# Patient Record
Sex: Female | Born: 1990 | Hispanic: Yes | Marital: Single | Smoking: Never smoker
Health system: Southern US, Community
[De-identification: ages and names within clinical notes are randomized; demographics above are authoritative.]

## PROBLEM LIST (undated history)

## (undated) DIAGNOSIS — O009 Unspecified ectopic pregnancy without intrauterine pregnancy: Secondary | ICD-10-CM

## (undated) DIAGNOSIS — O039 Complete or unspecified spontaneous abortion without complication: Secondary | ICD-10-CM

## (undated) HISTORY — DX: Unspecified ectopic pregnancy without intrauterine pregnancy: O00.90

## (undated) HISTORY — DX: Complete or unspecified spontaneous abortion without complication: O03.9

---

## 2011-10-11 DIAGNOSIS — O009 Unspecified ectopic pregnancy without intrauterine pregnancy: Secondary | ICD-10-CM

## 2011-10-11 HISTORY — DX: Unspecified ectopic pregnancy without intrauterine pregnancy: O00.90

## 2012-01-10 DIAGNOSIS — O039 Complete or unspecified spontaneous abortion without complication: Secondary | ICD-10-CM

## 2012-01-10 HISTORY — DX: Complete or unspecified spontaneous abortion without complication: O03.9

## 2013-04-17 ENCOUNTER — Ambulatory Visit (INDEPENDENT_AMBULATORY_CARE_PROVIDER_SITE_OTHER): Payer: PRIVATE HEALTH INSURANCE | Admitting: Physician Assistant

## 2013-04-17 VITALS — BP 94/62 | HR 76 | Temp 98.9°F | Resp 18 | Ht 60.5 in | Wt 125.8 lb

## 2013-04-17 DIAGNOSIS — B9689 Other specified bacterial agents as the cause of diseases classified elsewhere: Secondary | ICD-10-CM

## 2013-04-17 DIAGNOSIS — N898 Other specified noninflammatory disorders of vagina: Secondary | ICD-10-CM

## 2013-04-17 DIAGNOSIS — Z309 Encounter for contraceptive management, unspecified: Secondary | ICD-10-CM

## 2013-04-17 DIAGNOSIS — Z124 Encounter for screening for malignant neoplasm of cervix: Secondary | ICD-10-CM

## 2013-04-17 DIAGNOSIS — IMO0001 Reserved for inherently not codable concepts without codable children: Secondary | ICD-10-CM

## 2013-04-17 DIAGNOSIS — N760 Acute vaginitis: Secondary | ICD-10-CM

## 2013-04-17 DIAGNOSIS — Z113 Encounter for screening for infections with a predominantly sexual mode of transmission: Secondary | ICD-10-CM

## 2013-04-17 LAB — POCT URINALYSIS DIPSTICK
Bilirubin, UA: NEGATIVE
Leukocytes, UA: NEGATIVE
Nitrite, UA: NEGATIVE
Protein, UA: 30
Spec Grav, UA: 1.015
pH, UA: 8.5

## 2013-04-17 LAB — POCT WET PREP WITH KOH
Clue Cells Wet Prep HPF POC: 100
KOH Prep POC: NEGATIVE
RBC Wet Prep HPF POC: NEGATIVE
Trichomonas, UA: NEGATIVE
Yeast Wet Prep HPF POC: NEGATIVE

## 2013-04-17 LAB — POCT UA - MICROSCOPIC ONLY
Casts, Ur, LPF, POC: NEGATIVE
Crystals, Ur, HPF, POC: NEGATIVE
Mucus, UA: POSITIVE
Yeast, UA: NEGATIVE

## 2013-04-17 MED ORDER — NORELGESTROMIN-ETH ESTRADIOL 150-35 MCG/24HR TD PTWK: 1.0000 | MEDICATED_PATCH | TRANSDERMAL | Status: DC

## 2013-04-17 MED ORDER — METRONIDAZOLE 500 MG PO TABS: 500.0000 mg | ORAL_TABLET | Freq: Two times a day (BID) | ORAL | Status: DC

## 2013-04-17 NOTE — Progress Notes (Signed)
Subjective

## 2013-04-17 NOTE — Patient Instructions (Addendum)
Before you start trying to become pregnant again, please schedule with an OB/GYN to discuss pre-pregnancy planning and ways to reduce the risk of recurrent ectopic pregnancy. Use condoms to reduce the risk of sexually transmitted infections. We encourage you to obtain a flu vaccine each fall, to reduce the risk of influenza infection. It is important that you COMPLETE the antibiotic.   Ethinyl Estradiol; Norelgestromin skin patches What is this medicine? ETHINYL ESTRADIOL;NORELGESTROMIN (ETH in il es tra DYE ole; nor el JES troe min) skin patch is used as a contraceptive (birth control method). This medicine combines two types of female hormones, an estrogen and a progestin. This patch is used to prevent ovulation and pregnancy. This medicine may be used for other purposes; ask your health care provider or pharmacist if you have questions. What should I tell my health care provider before I take this medicine? They need to know if you have or ever had any of these conditions: -abnormal vaginal bleeding -blood vessel disease or blood clots -breast, cervical, endometrial, ovarian, liver, or uterine cancer -diabetes -gallbladder disease -heart disease or recent heart attack -high blood pressure -high cholesterol -kidney disease -liver disease -migraine headaches -stroke -systemic lupus erythematosus (SLE) -tobacco smoker -an unusual or allergic reaction to estrogens, progestins, other medicines, foods, dyes, or preservatives -pregnant or trying to get pregnant -breast-feeding How should I use this medicine? This patch is applied to the skin. Follow the directions on the prescription label. Apply to clean, dry, healthy skin on the buttock, abdomen, upper outer arm or upper torso, in a place where it will not be rubbed by tight clothing. Do not use lotions or other cosmetics on the site where the patch will go. Press the patch firmly in place for 10 seconds to ensure good contact with the  skin. Change the patch every 7 days on the same day of the week for 3 weeks. You will then have a break from the patch for 1 week, after which you will apply a new patch. Do not use your medicine more often than directed. Contact your pediatrician regarding the use of this medicine in children. Special care may be needed. This medicine has been used in female children who have started having menstrual periods. A patient package insert for the product will be given with each prescription and refill. Read this sheet carefully each time. The sheet may change frequently. Overdosage: If you think you have taken too much of this medicine contact a poison control center or emergency room at once. NOTE: This medicine is only for you. Do not share this medicine with others. What if I miss a dose? You will need to replace your patch once a week as directed. If your patch is lost or falls off, contact your health care professional for advice. You may need to use another form of birth control if your patch has been off for more than 1 day. What may interact with this medicine? -acetaminophen -antibiotics or medicines for infections, especially rifampin, rifabutin, rifapentine, and griseofulvin, and possibly penicillins or tetracyclines -aprepitant -ascorbic acid (vitamin C) -atorvastatin -barbiturate medicines, such as phenobarbital -bosentan -carbamazepine -caffeine -clofibrate -cyclosporine -dantrolene -doxercalciferol -felbamate -grapefruit juice -hydrocortisone -medicines for anxiety or sleeping problems, such as diazepam or temazepam -medicines for diabetes, including pioglitazone -modafinil -mycophenolate -nefazodone -oxcarbazepine -phenytoin -prednisolone -ritonavir or other medicines for HIV infection or AIDS -rosuvastatin -selegiline -soy isoflavones supplements -St. John's wort -tamoxifen or raloxifene -theophylline -thyroid hormones -topiramate -warfarin This list may not  describe all  possible interactions. Give your health care provider a list of all the medicines, herbs, non-prescription drugs, or dietary supplements you use. Also tell them if you smoke, drink alcohol, or use illegal drugs. Some items may interact with your medicine. What should I watch for while using this medicine? Visit your doctor or health care professional for regular checks on your progress. You will need a regular breast and pelvic exam and Pap smear while on this medicine. Use an additional method of contraception during the first cycle that you use this patch. If you have any reason to think you are pregnant, stop using this medicine right away and contact your doctor or health care professional. If you are using this medicine for hormone related problems, it may take several cycles of use to see improvement in your condition. Smoking increases the risk of getting a blood clot or having a stroke while you are using hormonal birth control, especially if you are more than 22 years old. You are strongly advised not to smoke. This medicine can make your body retain fluid, making your fingers, hands, or ankles swell. Your blood pressure can go up. Contact your doctor or health care professional if you feel you are retaining fluid. This medicine can make you more sensitive to the sun. Keep out of the sun. If you cannot avoid being in the sun, wear protective clothing and use sunscreen. Do not use sun lamps or tanning beds/booths. If you wear contact lenses and notice visual changes, or if the lenses begin to feel uncomfortable, consult your eye care specialist. In some women, tenderness, swelling, or minor bleeding of the gums may occur. Notify your dentist if this happens. Brushing and flossing your teeth regularly may help limit this. See your dentist regularly and inform your dentist of the medicines you are taking. If you are going to have elective surgery or a MRI, you may need to stop using  this medicine before the surgery or MRI. Consult your health care professional for advice. This medicine does not protect you against HIV infection (AIDS) or any other sexually transmitted diseases. What side effects may I notice from receiving this medicine? Side effects that you should report to your doctor or health care professional as soon as possible: -breast tissue changes or discharge -changes in vaginal bleeding during your period or between your periods -chest pain -coughing up blood -dizziness or fainting spells -headaches or migraines -leg, arm or groin pain -severe or sudden headaches -stomach pain (severe) -sudden shortness of breath -sudden loss of coordination, especially on one side of the body -speech problems -symptoms of vaginal infection like itching, irritation or unusual discharge -tenderness in the upper abdomen -vomiting -weakness or numbness in the arms or legs, especially on one side of the body -yellowing of the eyes or skin Side effects that usually do not require medical attention (report to your doctor or health care professional if they continue or are bothersome): -breakthrough bleeding and spotting that continues beyond the 3 initial cycles of pills -breast tenderness -mood changes, anxiety, depression, frustration, anger, or emotional outbursts -increased sensitivity to sun or ultraviolet light -nausea -skin rash, acne, or brown spots on the skin -weight gain (slight) This list may not describe all possible side effects. Call your doctor for medical advice about side effects. You may report side effects to FDA at 1-800-FDA-1088. Where should I keep my medicine? Keep out of the reach of children. Store at room temperature between 15 and 30 degrees C (59  and 86 degrees F). Keep the patch in its pouch until time of use. Throw away any unused medicine after the expiration date. Dispose of used patches properly. Since a used patch may still contain  active hormones, fold the patch in half so that it sticks to itself prior to disposal. Throw away in a place where children or pets cannot reach. NOTE: This sheet is a summary. It may not cover all possible information. If you have questions about this medicine, talk to your doctor, pharmacist, or health care provider.  2013, [employer]. (06/13/2008 12:06:24 PM)

## 2013-04-17 NOTE — Progress Notes (Signed)
Subjective:    Patient ID: Sophia [name], female    DOB: 10/15/1990, 22 y.o.   MRN: [id number]  HPI  Sophia [name] is a 22 year old female presents with vaginal irritation and odorous discharge. She has had similar problems in the past. She has a history of chlamydia but no STI currently. She was tested in the past year and was negative. She is currently sexually active and requests screening for STIs, "For everything.".   She would also like to discuss birth control options. She received Depo Provera injection x 1 in 10/2011 after oral treatment for ectopic pregnancy, and then became pregnant again before she was due for her second dose. That pregnancy ended in miscarriage. She was also unhappy with the Depo Provera due to weight gain. Hanh does not feel that she would take OCP reliably and doe not want to use anything that is inserted. She has researched the patch and thinks this is the best fit for her.    Review of Systems  Constitutional: Negative for fever.  Genitourinary: Positive for vaginal discharge and pelvic pain (pain 2-3 days before start of menses). Negative for dysuria, frequency, hematuria, vaginal bleeding, genital sores and menstrual problem.       Objective:   Physical Exam  Constitutional: She is oriented to person, place, and time. Vital signs are normal. She appears well-developed and well-nourished.  Pulmonary/Chest: Effort normal.  Genitourinary: Uterus normal. Rectal exam shows no external hemorrhoid. No breast swelling, tenderness (extremely ticklish) or discharge. Pelvic exam was performed with patient supine. There is no rash, tenderness or lesion on the right labia. There is no rash, tenderness or lesion on the left labia. Cervix exhibits no motion tenderness, no discharge and no friability. Right adnexum displays no mass, no tenderness and no fullness. Left adnexum displays no mass, no tenderness and no fullness. No erythema, tenderness or bleeding around  the vagina. Vaginal discharge (moderate to large thick malodorous fluid in vaginal vault) found.  Lymphadenopathy:       Right: No inguinal adenopathy present.       Left: No inguinal adenopathy present.  Neurological: She is alert and oriented to person, place, and time.  Skin: Skin is warm and dry.  Psychiatric: Her mood appears anxious.   BP 94/62  Pulse 76  Temp(Src) 98.9 F (37.2 C) (Oral)  Resp 18  Ht 5' 0.5" (1.537 m)  Wt 125 lb 12.8 oz (57.063 kg)  BMI 24.15 kg/m2  SpO2 100%  LMP 04/15/2013  Recent Results (from the past 2160 hour(s))  POCT URINALYSIS DIPSTICK     Status: None   Collection Time    04/17/13  6:37 PM      Result Value Range   Color, UA yellow     Clarity, UA cloudy     Glucose, UA neg     Bilirubin, UA neg     Ketones, UA neg     Spec Grav, UA 1.015     Blood, UA neg     pH, UA 8.5     Protein, UA 30     Urobilinogen, UA 1.0     Nitrite, UA neg     Leukocytes, UA Negative    POCT UA - MICROSCOPIC ONLY     Status: None   Collection Time    04/17/13  6:37 PM      Result Value Range   WBC, Ur, HPF, POC neg     RBC, urine, microscopic neg  Bacteria, U Microscopic 2+     Mucus, UA positive     Epithelial cells, urine per micros 0-1     Crystals, Ur, HPF, POC neg     Casts, Ur, LPF, POC neg     Yeast, UA neg     Amorphous large    POCT WET PREP WITH KOH     Status: None   Collection Time    04/17/13  8:12 PM      Result Value Range   Trichomonas, UA Negative     Clue Cells Wet Prep HPF POC 100%     Epithelial Wet Prep HPF POC 15-20     Yeast Wet Prep HPF POC neg     Bacteria Wet Prep HPF POC 4+     RBC Wet Prep HPF POC neg     WBC Wet Prep HPF POC 0-2     KOH Prep POC Negative          Assessment & Plan:  Discharge from the vagina - Plan: POCT urinalysis dipstick, POCT UA - Microscopic Only, POCT Wet Prep with KOH,   BV (bacterial vaginosis) - Plan: metroNIDAZOLE (FLAGYL) 500 MG tablet  Routine screening for STI (sexually  transmitted infection) - Plan: HSV(herpes simplex vrs) 1+2 ab-IgG, HIV antibody, Hepatitis B surface antibody, Hepatitis B surface antigen, Hepatitis C antibody, RPR, Pap IG, CT/NG w/ reflex HPV when ASC-U  Contraception - Plan: norelgestromin-ethinyl estradiol (ORTHO EVRA) 150-35 MCG/24HR transdermal patch  Screening for cervical cancer- Plan: Pap test  Spent significant time counseling patient on pros/cons of various birth control options, pregnancy and female sexual health. Patient counseled on how to properly use the Ortho Evra patch and to use condoms w/sexual intercourse for the next month to prevent pregnancy. Counseled using condoms even with the patch to prevent STI. Counseled referral to OB/GYN before trying to become pregnant again or if premenstrual pain continues.

## 2013-04-18 LAB — HEPATITIS B SURFACE ANTIGEN: Hepatitis B Surface Ag: NEGATIVE

## 2013-04-18 NOTE — Progress Notes (Signed)
I have examined this patient along with the student and agree.  

## 2013-04-20 LAB — PAP IG, CT-NG, RFX HPV ASCU
Chlamydia Probe Amp: NEGATIVE
GC Probe Amp: NEGATIVE

## 2013-04-20 LAB — HSV(HERPES SIMPLEX VRS) I + II AB-IGG
HSV 1 Glycoprotein G Ab, IgG: 10.81 IV — ABNORMAL HIGH
HSV 2 Glycoprotein G Ab, IgG: 0.23 IV

## 2013-06-04 ENCOUNTER — Ambulatory Visit (INDEPENDENT_AMBULATORY_CARE_PROVIDER_SITE_OTHER): Payer: PRIVATE HEALTH INSURANCE | Admitting: Family Medicine

## 2013-06-04 VITALS — BP 90/50 | HR 74 | Temp 98.9°F | Resp 18 | Ht 60.5 in | Wt 127.8 lb

## 2013-06-04 DIAGNOSIS — Z8742 Personal history of other diseases of the female genital tract: Secondary | ICD-10-CM

## 2013-06-04 DIAGNOSIS — Z8759 Personal history of other complications of pregnancy, childbirth and the puerperium: Secondary | ICD-10-CM

## 2013-06-04 DIAGNOSIS — Z331 Pregnant state, incidental: Secondary | ICD-10-CM

## 2013-06-04 DIAGNOSIS — Z3490 Encounter for supervision of normal pregnancy, unspecified, unspecified trimester: Secondary | ICD-10-CM

## 2013-06-04 DIAGNOSIS — N912 Amenorrhea, unspecified: Secondary | ICD-10-CM

## 2013-06-04 NOTE — Patient Instructions (Signed)

## 2013-06-04 NOTE — Progress Notes (Signed)
[facility]:  Office Visit  Chief Complaint:  Chief Complaint  Patient presents with  . Possible Pregnancy    pt took home pregnancy test that was positive, she is late and request blood test    HPI: Sophia Sophia [name] is a 22 y.o. female who is here for pregnancy test, 1 ectopic at 6 weeks when she was 20 and then she had a misscarriage at 3 weeks at age 61. LMP 05/05/2013 Menarche 11, irregular length, gets it every 30  Days, blood cots, cramping, heavy flow previosly  But now normal Has a history of STD + chlamydia Last tested several months ago for STDs Requesting blood work for hcG but then declined  Past Medical History  Diagnosis Date  . Ectopic pregnancy 10/2011  . Miscarriage 01/2012   History reviewed. No pertinent past surgical history. History   Social History  . Marital Status: Single    Spouse Name: n/a    Number of Children: 0  . Years of Education: 15   Occupational History  . [employer] Agent     [employer]   Social History Main Topics  . Smoking status: Never Smoker   . Smokeless tobacco: Never Used  . Alcohol Use: Yes     Comment: occasional  . Drug Use: No  . Sexual Activity: Yes    Partners: Male   Other Topics Concern  . None   Social History Narrative   Lives with a friend/roommate.  Working on completing her undergraduate degree.   Family History  Problem Relation Age of Onset  . Diabetes Mother   . ADD / ADHD Brother   . Diabetes Maternal Grandmother    No Known Allergies Prior to Admission medications   Medication Sig Start Date End Date Taking? Authorizing Provider  metroNIDAZOLE (FLAGYL) 500 MG tablet Take 1 tablet (500 mg total) by mouth 2 (two) times daily with a meal. DO NOT CONSUME ALCOHOL WHILE TAKING THIS MEDICATION. 04/17/13   Sophia Tessa Lerner, PA-C  norelgestromin-ethinyl estradiol (ORTHO EVRA) 150-35 MCG/24HR transdermal patch Place 1 patch onto the skin once a week. 04/17/13   Sophia S Jeffery, PA-C     ROS:  The patient denies fevers, chills, night sweats, unintentional Sophia [name] loss, chest pain, palpitations, wheezing, dyspnea on exertion, nausea, vomiting, abdominal pain, dysuria, hematuria, melena, numbness, weakness, or tingling.   All other systems have been reviewed and were otherwise negative with the exception of those mentioned in the HPI and as above.    PHYSICAL EXAM: Filed Vitals:   06/04/13 1641  BP: 90/50  Pulse: 74  Temp: 98.9 F (37.2 C)  Resp: 18   Filed Vitals:   06/04/13 1641  Height: 5' 0.5" (1.537 m)  Sophia [name]: 127 lb 12.8 oz (57.97 kg)   Body mass index is 24.54 kg/(m^2).  General: Alert, no acute distress HEENT:  Normocephalic, atraumatic, oropharynx patent. EOMI, PERRLA Cardiovascular:  Regular rate and rhythm, no rubs murmurs or gallops.  No Carotid bruits, radial pulse intact. No pedal edema.  Respiratory: Clear to auscultation bilaterally.  No wheezes, rales, or rhonchi.  No cyanosis, no use of accessory musculature GI: No organomegaly, abdomen is soft and non-tender, positive bowel sounds.  No masses. Skin: No rashes. Neurologic: Facial musculature symmetric. Psychiatric: Patient is appropriate throughout our interaction. Lymphatic: No cervical lymphadenopathy Musculoskeletal: Gait intact.   LABS: Results for orders placed in visit on 06/04/13  POCT URINE PREGNANCY      Result Value Range   Preg Test,  Ur Positive       EKG/XRAY:   Primary read interpreted by Dr. Conley [name] at [facility].   ASSESSMENT/PLAN: Encounter Diagnoses  Name Primary?  . Amenorrhea Yes  . Pregnant   . History of ectopic pregnancy    22 Y/o Hispanic female [id number] who is roughly 4 weeks and 2 days gestation  based on LMP of 05/05/2013. She wanted a blood HcG in addition to urine pregnancy test, after much discussion she declined.  Advise to take prenatal vitamins, avoid alcohol and tobacco use She will set herself up with an Ob/gyn as soon as possible due to history of ectopics, we can  refer her to [facility] prn F/u prn  Gross sideeffects, risk and benefits, and alternatives of medications d/w patient. Patient is aware that all medications have potential sideeffects and we are unable to predict every sideeffect or drug-drug interaction that may occur.  Sophia Capri PHUONG, DO 06/04/2013 5:09 PM

## 2013-08-26 ENCOUNTER — Encounter (HOSPITAL_COMMUNITY): Payer: Self-pay | Admitting: Anesthesiology

## 2013-08-26 ENCOUNTER — Emergency Department (HOSPITAL_COMMUNITY): Payer: Self-pay

## 2013-08-26 ENCOUNTER — Encounter (HOSPITAL_COMMUNITY): Payer: Self-pay | Admitting: Emergency Medicine

## 2013-08-26 ENCOUNTER — Observation Stay (HOSPITAL_COMMUNITY)
Admission: EM | Admit: 2013-08-26 | Discharge: 2013-08-27 | Disposition: A | Discharge status: Home / Self Care | Payer: Self-pay | Attending: General Surgery | Admitting: General Surgery

## 2013-08-26 ENCOUNTER — Encounter (HOSPITAL_COMMUNITY)
Admission: EM | Disposition: A | Discharge status: Home / Self Care | Payer: Self-pay | Source: Home / Self Care | Attending: Emergency Medicine

## 2013-08-26 ENCOUNTER — Observation Stay (HOSPITAL_COMMUNITY): Payer: Self-pay | Admitting: Anesthesiology

## 2013-08-26 DIAGNOSIS — K3580 Unspecified acute appendicitis: Secondary | ICD-10-CM

## 2013-08-26 DIAGNOSIS — K37 Unspecified appendicitis: Secondary | ICD-10-CM

## 2013-08-26 HISTORY — PX: LAPAROSCOPIC APPENDECTOMY: SHX408

## 2013-08-26 LAB — CBC WITH DIFFERENTIAL/PLATELET
BASOS PCT: 0 % (ref 0–1)
Basophils Absolute: 0 10*3/uL (ref 0.0–0.1)
EOS ABS: 0.1 10*3/uL (ref 0.0–0.7)
Eosinophils Relative: 1 % (ref 0–5)
HCT: 36.4 % (ref 36.0–46.0)
Hemoglobin: 13.1 g/dL (ref 12.0–15.0)
Lymphocytes Relative: 11 % — ABNORMAL LOW (ref 12–46)
Lymphs Abs: 1.7 10*3/uL (ref 0.7–4.0)
MCH: 32.3 pg (ref 26.0–34.0)
MCHC: 36 g/dL (ref 30.0–36.0)
MCV: 89.9 fL (ref 78.0–100.0)
Monocytes Absolute: 0.8 10*3/uL (ref 0.1–1.0)
Monocytes Relative: 5 % (ref 3–12)
NEUTROS ABS: 12.2 10*3/uL — AB (ref 1.7–7.7)
Neutrophils Relative %: 83 % — ABNORMAL HIGH (ref 43–77)
PLATELETS: 235 10*3/uL (ref 150–400)
RBC: 4.05 MIL/uL (ref 3.87–5.11)
RDW: 12.4 % (ref 11.5–15.5)
WBC: 14.8 10*3/uL — ABNORMAL HIGH (ref 4.0–10.5)

## 2013-08-26 LAB — URINALYSIS, ROUTINE W REFLEX MICROSCOPIC
BILIRUBIN URINE: NEGATIVE
Glucose, UA: NEGATIVE mg/dL
HGB URINE DIPSTICK: NEGATIVE
Ketones, ur: 40 mg/dL — AB
Leukocytes, UA: NEGATIVE
Nitrite: NEGATIVE
PH: 6 (ref 5.0–8.0)
Protein, ur: NEGATIVE mg/dL
SPECIFIC GRAVITY, URINE: 1.024 (ref 1.005–1.030)
Urobilinogen, UA: 0.2 mg/dL (ref 0.0–1.0)

## 2013-08-26 LAB — COMPREHENSIVE METABOLIC PANEL WITH GFR
ALT: 27 U/L (ref 0–35)
AST: 26 U/L (ref 0–37)
Albumin: 4 g/dL (ref 3.5–5.2)
Alkaline Phosphatase: 53 U/L (ref 39–117)
BUN: 15 mg/dL (ref 6–23)
CO2: 22 meq/L (ref 19–32)
Calcium: 9.1 mg/dL (ref 8.4–10.5)
Chloride: 102 meq/L (ref 96–112)
Creatinine, Ser: 0.68 mg/dL (ref 0.50–1.10)
GFR calc Af Amer: >90 mL/min (ref >90)
GFR calc non Af Amer: >90 mL/min (ref >90)
Glucose, Bld: 116 mg/dL — ABNORMAL HIGH (ref 70–99)
Potassium: 3.7 meq/L (ref 3.7–5.3)
Sodium: 139 meq/L (ref 137–147)
Total Bilirubin: 1 mg/dL (ref 0.3–1.2)
Total Protein: 7.4 g/dL (ref 6.0–8.3)

## 2013-08-26 LAB — LIPASE, BLOOD: Lipase: 55 U/L (ref 11–59)

## 2013-08-26 LAB — POCT PREGNANCY, URINE: Preg Test, Ur: NEGATIVE

## 2013-08-26 SURGERY — APPENDECTOMY, LAPAROSCOPIC
Anesthesia: General

## 2013-08-26 MED ORDER — ONDANSETRON HCL 4 MG/2ML IJ SOLN: 4.0000 mg | Freq: Four times a day (QID) | INTRAMUSCULAR | Status: DC | PRN

## 2013-08-26 MED ORDER — DIPHENHYDRAMINE HCL 50 MG/ML IJ SOLN
25.0000 mg | Freq: Once | INTRAMUSCULAR | Status: AC
  Administered 2013-08-26: 25 mg via INTRAVENOUS
  Filled 2013-08-26: qty 1

## 2013-08-26 MED ORDER — MIDAZOLAM HCL 2 MG/2ML IJ SOLN
INTRAMUSCULAR | Status: AC
  Filled 2013-08-26: qty 2

## 2013-08-26 MED ORDER — OXYCODONE HCL 5 MG PO TABS: 5.0000 mg | ORAL_TABLET | Freq: Once | ORAL | Status: DC | PRN

## 2013-08-26 MED ORDER — DEXAMETHASONE SODIUM PHOSPHATE 4 MG/ML IJ SOLN
INTRAMUSCULAR | Status: DC | PRN
  Administered 2013-08-26: 8 mg via INTRAVENOUS

## 2013-08-26 MED ORDER — MEPERIDINE HCL 25 MG/ML IJ SOLN: 6.2500 mg | INTRAMUSCULAR | Status: DC | PRN

## 2013-08-26 MED ORDER — OXYCODONE HCL 5 MG/5ML PO SOLN: 5.0000 mg | Freq: Once | ORAL | Status: DC | PRN

## 2013-08-26 MED ORDER — ENOXAPARIN SODIUM 40 MG/0.4ML ~~LOC~~ SOLN
40.0000 mg | SUBCUTANEOUS | Status: DC
  Administered 2013-08-26: 40 mg via SUBCUTANEOUS
  Filled 2013-08-26 (×2): qty 0.4

## 2013-08-26 MED ORDER — SUCCINYLCHOLINE CHLORIDE 20 MG/ML IJ SOLN
INTRAMUSCULAR | Status: AC
  Filled 2013-08-26: qty 1

## 2013-08-26 MED ORDER — MORPHINE SULFATE 4 MG/ML IJ SOLN
4.0000 mg | Freq: Once | INTRAMUSCULAR | Status: AC
  Administered 2013-08-26: 4 mg via INTRAVENOUS
  Filled 2013-08-26: qty 1

## 2013-08-26 MED ORDER — MORPHINE SULFATE 2 MG/ML IJ SOLN: 2.0000 mg | INTRAMUSCULAR | Status: DC | PRN

## 2013-08-26 MED ORDER — NEOSTIGMINE METHYLSULFATE 1 MG/ML IJ SOLN
INTRAMUSCULAR | Status: DC | PRN
  Administered 2013-08-26: 3 mg via INTRAVENOUS

## 2013-08-26 MED ORDER — SODIUM CHLORIDE 0.9 % IV SOLN
INTRAVENOUS | Status: DC
  Administered 2013-08-26 (×2): via INTRAVENOUS

## 2013-08-26 MED ORDER — ONDANSETRON HCL 4 MG/2ML IJ SOLN: 4.0000 mg | Freq: Once | INTRAMUSCULAR | Status: DC | PRN

## 2013-08-26 MED ORDER — ONDANSETRON HCL 4 MG/2ML IJ SOLN
INTRAMUSCULAR | Status: AC
  Filled 2013-08-26: qty 2

## 2013-08-26 MED ORDER — ROCURONIUM BROMIDE 100 MG/10ML IV SOLN
INTRAVENOUS | Status: DC | PRN
  Administered 2013-08-26: 15 mg via INTRAVENOUS
  Administered 2013-08-26: 5 mg via INTRAVENOUS

## 2013-08-26 MED ORDER — MIDAZOLAM HCL 5 MG/5ML IJ SOLN
INTRAMUSCULAR | Status: DC | PRN
  Administered 2013-08-26: 2 mg via INTRAVENOUS

## 2013-08-26 MED ORDER — HYDROMORPHONE HCL PF 1 MG/ML IJ SOLN
0.2500 mg | INTRAMUSCULAR | Status: DC | PRN
  Administered 2013-08-26 (×3): 0.5 mg via INTRAVENOUS

## 2013-08-26 MED ORDER — FENTANYL CITRATE 0.05 MG/ML IJ SOLN
INTRAMUSCULAR | Status: AC
  Filled 2013-08-26: qty 5

## 2013-08-26 MED ORDER — SODIUM CHLORIDE 0.9 % IV SOLN
1000.0000 mL | Freq: Once | INTRAVENOUS | Status: AC
  Administered 2013-08-26: 1000 mL via INTRAVENOUS

## 2013-08-26 MED ORDER — ONDANSETRON HCL 4 MG/2ML IJ SOLN
INTRAMUSCULAR | Status: DC | PRN
  Administered 2013-08-26: 4 mg via INTRAVENOUS

## 2013-08-26 MED ORDER — PROPOFOL 10 MG/ML IV BOLUS
INTRAVENOUS | Status: AC
  Filled 2013-08-26: qty 20

## 2013-08-26 MED ORDER — DEXAMETHASONE SODIUM PHOSPHATE 4 MG/ML IJ SOLN
INTRAMUSCULAR | Status: AC
  Filled 2013-08-26: qty 2

## 2013-08-26 MED ORDER — METOCLOPRAMIDE HCL 5 MG/ML IJ SOLN
10.0000 mg | Freq: Once | INTRAMUSCULAR | Status: AC
Start: 2013-08-26 — End: 2013-08-26
  Administered 2013-08-26: 10 mg via INTRAVENOUS
  Filled 2013-08-26: qty 2

## 2013-08-26 MED ORDER — PIPERACILLIN-TAZOBACTAM 3.375 G IVPB
3.3750 g | Freq: Three times a day (TID) | INTRAVENOUS | Status: DC
  Administered 2013-08-26: 3.375 g via INTRAVENOUS
  Filled 2013-08-26 (×3): qty 50

## 2013-08-26 MED ORDER — SODIUM CHLORIDE 0.9 % IV SOLN
1000.0000 mL | INTRAVENOUS | Status: DC
  Administered 2013-08-26 (×2): 1000 mL via INTRAVENOUS

## 2013-08-26 MED ORDER — LIDOCAINE HCL (CARDIAC) 20 MG/ML IV SOLN
INTRAVENOUS | Status: DC | PRN
  Administered 2013-08-26: 100 mg via INTRAVENOUS

## 2013-08-26 MED ORDER — PROPOFOL 10 MG/ML IV BOLUS
INTRAVENOUS | Status: DC | PRN
  Administered 2013-08-26: 100 mg via INTRAVENOUS

## 2013-08-26 MED ORDER — LACTATED RINGERS IV SOLN
INTRAVENOUS | Status: DC | PRN
  Administered 2013-08-26 (×2): via INTRAVENOUS

## 2013-08-26 MED ORDER — SUCCINYLCHOLINE CHLORIDE 20 MG/ML IJ SOLN
INTRAMUSCULAR | Status: DC | PRN
  Administered 2013-08-26: 120 mg via INTRAVENOUS

## 2013-08-26 MED ORDER — BUPIVACAINE-EPINEPHRINE (PF) 0.25% -1:200000 IJ SOLN
INTRAMUSCULAR | Status: AC
  Filled 2013-08-26: qty 30

## 2013-08-26 MED ORDER — GLYCOPYRROLATE 0.2 MG/ML IJ SOLN
INTRAMUSCULAR | Status: DC | PRN
  Administered 2013-08-26: 0.4 mg via INTRAVENOUS

## 2013-08-26 MED ORDER — HYDROMORPHONE HCL PF 1 MG/ML IJ SOLN
INTRAMUSCULAR | Status: AC
  Administered 2013-08-26: 0.5 mg via INTRAVENOUS
  Filled 2013-08-26: qty 1

## 2013-08-26 MED ORDER — LACTATED RINGERS IV SOLN: INTRAVENOUS | Status: DC | PRN

## 2013-08-26 MED ORDER — BUPIVACAINE HCL 0.25 % IJ SOLN
INTRAMUSCULAR | Status: DC | PRN
  Administered 2013-08-26: 4 mL

## 2013-08-26 MED ORDER — ONDANSETRON HCL 4 MG/2ML IJ SOLN
4.0000 mg | Freq: Once | INTRAMUSCULAR | Status: AC
  Administered 2013-08-26: 4 mg via INTRAVENOUS
  Filled 2013-08-26: qty 2

## 2013-08-26 MED ORDER — FENTANYL CITRATE 0.05 MG/ML IJ SOLN
INTRAMUSCULAR | Status: DC | PRN
  Administered 2013-08-26: 50 ug via INTRAVENOUS
  Administered 2013-08-26: 100 ug via INTRAVENOUS
  Administered 2013-08-26 (×2): 50 ug via INTRAVENOUS

## 2013-08-26 MED ORDER — SODIUM CHLORIDE 0.9 % IR SOLN
Status: DC | PRN
  Administered 2013-08-26: 1000 mL

## 2013-08-26 MED ORDER — HYDROCODONE-ACETAMINOPHEN 5-325 MG PO TABS
1.0000 | ORAL_TABLET | ORAL | Status: DC | PRN
  Administered 2013-08-26 – 2013-08-27 (×3): 2 via ORAL
  Filled 2013-08-26 (×3): qty 2

## 2013-08-26 MED ORDER — LIDOCAINE HCL (CARDIAC) 20 MG/ML IV SOLN
INTRAVENOUS | Status: AC
  Filled 2013-08-26: qty 5

## 2013-08-26 MED ORDER — ROCURONIUM BROMIDE 50 MG/5ML IV SOLN
INTRAVENOUS | Status: AC
  Filled 2013-08-26: qty 1

## 2013-08-26 MED ORDER — SODIUM CHLORIDE 0.9 % IV SOLN
INTRAVENOUS | Status: DC
  Administered 2013-08-26: 22:00:00 via INTRAVENOUS

## 2013-08-26 MED ORDER — IOHEXOL 300 MG/ML  SOLN
25.0000 mL | INTRAMUSCULAR | Status: AC
  Administered 2013-08-26: 25 mL via ORAL

## 2013-08-26 MED ORDER — IOHEXOL 300 MG/ML  SOLN
100.0000 mL | Freq: Once | INTRAMUSCULAR | Status: AC | PRN
  Administered 2013-08-26: 100 mL via INTRAVENOUS

## 2013-08-26 MED ORDER — HYDROMORPHONE HCL PF 1 MG/ML IJ SOLN
INTRAMUSCULAR | Status: AC
  Filled 2013-08-26: qty 1

## 2013-08-26 SURGICAL SUPPLY — 39 items
APPLIER CLIP 5 13 M/L LIGAMAX5 (MISCELLANEOUS)
BENZOIN TINCTURE PRP APPL 2/3 (GAUZE/BANDAGES/DRESSINGS) ×3 IMPLANT
BLADE SURG ROTATE 9660 (MISCELLANEOUS) IMPLANT
CANISTER SUCTION 2500CC (MISCELLANEOUS) ×3 IMPLANT
CHLORAPREP W/TINT 26ML (MISCELLANEOUS) ×3 IMPLANT
CLIP APPLIE 5 13 M/L LIGAMAX5 (MISCELLANEOUS) IMPLANT
CLOSURE STERI-STRIP 1/2X4 (GAUZE/BANDAGES/DRESSINGS) ×1
CLSR STERI-STRIP ANTIMIC 1/2X4 (GAUZE/BANDAGES/DRESSINGS) ×2 IMPLANT
COVER SURGICAL LIGHT HANDLE (MISCELLANEOUS) ×3 IMPLANT
COVER TRANSDUCER ULTRASND (DRAPES) ×3 IMPLANT
DEVICE TROCAR PUNCTURE CLOSURE (ENDOMECHANICALS) ×3 IMPLANT
DRAPE UTILITY 15X26 W/TAPE STR (DRAPE) ×6 IMPLANT
ELECT REM PT RETURN 9FT ADLT (ELECTROSURGICAL) ×3
ELECTRODE REM PT RTRN 9FT ADLT (ELECTROSURGICAL) ×1 IMPLANT
ENDOLOOP SUT PDS II  0 18 (SUTURE) ×6
ENDOLOOP SUT PDS II 0 18 (SUTURE) ×3 IMPLANT
GAUZE SPONGE 2X2 8PLY STRL LF (GAUZE/BANDAGES/DRESSINGS) ×1 IMPLANT
GLOVE BIO SURGEON STRL SZ7.5 (GLOVE) ×3 IMPLANT
GOWN STRL NON-REIN LRG LVL3 (GOWN DISPOSABLE) ×6 IMPLANT
GOWN STRL REIN XL XLG (GOWN DISPOSABLE) ×3 IMPLANT
KIT BASIN OR (CUSTOM PROCEDURE TRAY) ×3 IMPLANT
KIT ROOM TURNOVER OR (KITS) ×3 IMPLANT
NEEDLE INSUFFLATION 14GA 120MM (NEEDLE) ×3 IMPLANT
NS IRRIG 1000ML POUR BTL (IV SOLUTION) ×3 IMPLANT
PAD ARMBOARD 7.5X6 YLW CONV (MISCELLANEOUS) ×6 IMPLANT
SCISSORS LAP 5X35 DISP (ENDOMECHANICALS) ×3 IMPLANT
SET IRRIG TUBING LAPAROSCOPIC (IRRIGATION / IRRIGATOR) ×3 IMPLANT
SLEEVE ENDOPATH XCEL 5M (ENDOMECHANICALS) ×3 IMPLANT
SPECIMEN JAR SMALL (MISCELLANEOUS) ×3 IMPLANT
SPONGE GAUZE 2X2 STER 10/PKG (GAUZE/BANDAGES/DRESSINGS) ×2
SUT MNCRL AB 3-0 PS2 18 (SUTURE) ×3 IMPLANT
SUT VIC AB 1 BRD 54 (SUTURE) IMPLANT
TAPE CLOTH SURG 4X10 WHT LF (GAUZE/BANDAGES/DRESSINGS) ×3 IMPLANT
TOWEL OR 17X24 6PK STRL BLUE (TOWEL DISPOSABLE) ×3 IMPLANT
TOWEL OR 17X26 10 PK STRL BLUE (TOWEL DISPOSABLE) ×3 IMPLANT
TRAY FOLEY CATH 16FR SILVER (SET/KITS/TRAYS/PACK) ×3 IMPLANT
TRAY LAPAROSCOPIC (CUSTOM PROCEDURE TRAY) ×3 IMPLANT
TROCAR XCEL NON-BLD 11X100MML (ENDOMECHANICALS) ×3 IMPLANT
TROCAR XCEL NON-BLD 5MMX100MML (ENDOMECHANICALS) ×3 IMPLANT

## 2013-08-26 NOTE — ED Notes (Signed)
Pt states she awoke with abdominal pain to generalized lower abdomen, reports pain upon palpation. LMP four days ago, brownish discharge at first but she states she thinks it was normal. Pt states she still has appendix.

## 2013-08-26 NOTE — ED Provider Notes (Signed)
CSN: [id number]     Arrival date & time 08/26/13  [id number] History   First MD Initiated Contact with Patient 08/26/13 0423     Chief Complaint  Patient presents with  . Abdominal Pain     (Consider location/radiation/quality/duration/timing/severity/associated sxs/prior Treatment) Patient is a 23 y.o. female presenting with abdominal pain. The history is provided by the patient.  Abdominal Pain She was awakened at about midnight of by severe, generalized abdominal pain. Pain is crampy and she rates it at 10/10. Nothing makes it better nothing makes it worse. There is associated nausea and vomiting. Pain is not better following the Mrs. There's been no constipation diarrhea. She denies fever or chills. She denies any sick contacts. She has not done anything to try and treat her pain.  Past Medical History  Diagnosis Date  . Ectopic pregnancy 10/2011  . Miscarriage 01/2012   History reviewed. No pertinent past surgical history. Family History  Problem Relation Age of Onset  . Diabetes Mother   . ADD / ADHD Brother   . Diabetes Maternal Grandmother    History  Substance Use Topics  . Smoking status: Never Smoker   . Smokeless tobacco: Never Used  . Alcohol Use: Yes     Comment: occasional   OB History   Grav Para Term Preterm Abortions TAB SAB Ect Mult Living                 Review of Systems  Gastrointestinal: Positive for abdominal pain.  All other systems reviewed and are negative.      Allergies  Review of patient's allergies indicates no known allergies.  Home Medications  No current outpatient prescriptions on file. BP 100/56  Pulse 78  Temp(Src) 97.8 F (36.6 C)  Resp 22  Ht 5\' 1"  (1.549 m)  Wt 125 lb (56.7 kg)  BMI 23.63 kg/m2  SpO2 99%  LMP 08/23/2013 Physical Exam  Nursing note and vitals reviewed.  23 year old female, who appears to be in pain, but is in no acute distress. Vital signs are significant for tachypnea with respiratory rate of 22. Oxygen  saturation is 99%, which is normal. Head is normocephalic and atraumatic. PERRLA, EOMI. Oropharynx is clear. Neck is nontender and supple without adenopathy or JVD. Back is nontender and there is no CVA tenderness. Lungs are clear without rales, wheezes, or rhonchi. Chest is nontender. Heart has regular rate and rhythm without murmur. Abdomen is soft, flat, with mild tenderness diffusely. There is no localized pain. There is no rebound or guarding. There are no masses or hepatosplenomegaly and peristalsis is hypoactive. Extremities have no cyanosis or edema, full range of motion is present. Skin is warm and dry without rash. Neurologic: Mental status is normal, cranial nerves are intact, there are no motor or sensory deficits.  ED Course  Procedures (including critical care time) Labs Review Results for orders placed during the hospital encounter of 08/26/13  CBC WITH DIFFERENTIAL      Result Value Ref Range   WBC 14.8 (*) 4.0 - 10.5 K/uL   RBC 4.05  3.87 - 5.11 MIL/uL   Hemoglobin 13.1  12.0 - 15.0 g/dL   HCT 11.936.4  14.736.0 - 82.946.0 %   MCV 89.9  78.0 - 100.0 fL   MCH 32.3  26.0 - 34.0 pg   MCHC 36.0  30.0 - 36.0 g/dL   RDW 56.212.4  13.011.5 - 86.515.5 %   Platelets 235  150 - 400 K/uL   Neutrophils Relative %  83 (*) 43 - 77 %   Neutro Abs 12.2 (*) 1.7 - 7.7 K/uL   Lymphocytes Relative 11 (*) 12 - 46 %   Lymphs Abs 1.7  0.7 - 4.0 K/uL   Monocytes Relative 5  3 - 12 %   Monocytes Absolute 0.8  0.1 - 1.0 K/uL   Eosinophils Relative 1  0 - 5 %   Eosinophils Absolute 0.1  0.0 - 0.7 K/uL   Basophils Relative 0  0 - 1 %   Basophils Absolute 0.0  0.0 - 0.1 K/uL  COMPREHENSIVE METABOLIC PANEL      Result Value Ref Range   Sodium 139  137 - 147 mEq/L   Potassium 3.7  3.7 - 5.3 mEq/L   Chloride 102  96 - 112 mEq/L   CO2 22  19 - 32 mEq/L   Glucose, Bld 116 (*) 70 - 99 mg/dL   BUN 15  6 - 23 mg/dL   Creatinine, Ser 4.09  0.50 - 1.10 mg/dL   Calcium 9.1  8.4 - 81.1 mg/dL   Total Protein 7.4  6.0 -  8.3 g/dL   Albumin 4.0  3.5 - 5.2 g/dL   AST 26  0 - 37 U/L   ALT 27  0 - 35 U/L   Alkaline Phosphatase 53  39 - 117 U/L   Total Bilirubin 1.0  0.3 - 1.2 mg/dL   GFR calc non Af Amer >90  >90 mL/min   GFR calc Af Amer >90  >90 mL/min  LIPASE, BLOOD      Result Value Ref Range   Lipase 55  11 - 59 U/L  URINALYSIS, ROUTINE W REFLEX MICROSCOPIC      Result Value Ref Range   Color, Urine YELLOW  YELLOW   APPearance CLEAR  CLEAR   Specific Gravity, Urine 1.024  1.005 - 1.030   pH 6.0  5.0 - 8.0   Glucose, UA NEGATIVE  NEGATIVE mg/dL   Hgb urine dipstick NEGATIVE  NEGATIVE   Bilirubin Urine NEGATIVE  NEGATIVE   Ketones, ur 40 (*) NEGATIVE mg/dL   Protein, ur NEGATIVE  NEGATIVE mg/dL   Urobilinogen, UA 0.2  0.0 - 1.0 mg/dL   Nitrite NEGATIVE  NEGATIVE   Leukocytes, UA NEGATIVE  NEGATIVE  POCT PREGNANCY, URINE      Result Value Ref Range   Preg Test, Ur NEGATIVE  NEGATIVE   Ct Abdomen Pelvis W Contrast  08/26/2013   CLINICAL DATA:  Abdominal pain which is generalized with vomiting.  EXAM: CT ABDOMEN AND PELVIS WITH CONTRAST  TECHNIQUE: Multidetector CT imaging of the abdomen and pelvis was performed using the standard protocol following bolus administration of intravenous contrast.  CONTRAST:  OMNIPAQUE IOHEXOL 300 MG/ML  SOLN  COMPARISON:  None.  FINDINGS: Lung bases are within normal.  Abdominal images demonstrate a normal liver, spleen, pancreas and adrenal glands. There is no evidence of cholelithiasis. There is a small amount of pericholecystic fluid versus gallbladder wall thickening. Kidneys an ureters are normal.  The appendix measures 9 mm in diameter with mild mucosal enhancement. There is no significant adjacent inflammation or free fluid. No evidence of perforation or abscess. The appendix courses posteriorly as the tip is located just superior to the uterine fundus right of midline.  Pelvic images demonstrate a small amount of fluid in the cul-de-sac. Uterus nor ovaries are  unremarkable. Remaining bones and soft tissues are within normal.  IMPRESSION: Appendix slightly enlarged measuring 9 mm in diameter  with mild mucosal enhancement. No significant adjacent inflammatory change. Minimal free fluid in the pelvis. Findings are concerning for early acute appendicitis.  Mild pericholecystic fluid versus gallbladder wall thickening. Consider right upper quadrant ultrasound for better evaluation if clinically indicated.  Critical Value/emergent results were called by telephone at the time of interpretation on 08/26/2013 at 10:12 AM to Dr. Rolland Porter , who verbally acknowledged these results.   Electronically Signed   By: Elberta Fortis M.D.   On: 08/26/2013 10:12    Imaging Review Ct Abdomen Pelvis W Contrast  08/26/2013   CLINICAL DATA:  Abdominal pain which is generalized with vomiting.  EXAM: CT ABDOMEN AND PELVIS WITH CONTRAST  TECHNIQUE: Multidetector CT imaging of the abdomen and pelvis was performed using the standard protocol following bolus administration of intravenous contrast.  CONTRAST:  OMNIPAQUE IOHEXOL 300 MG/ML  SOLN  COMPARISON:  None.  FINDINGS: Lung bases are within normal.  Abdominal images demonstrate a normal liver, spleen, pancreas and adrenal glands. There is no evidence of cholelithiasis. There is a small amount of pericholecystic fluid versus gallbladder wall thickening. Kidneys an ureters are normal.  The appendix measures 9 mm in diameter with mild mucosal enhancement. There is no significant adjacent inflammation or free fluid. No evidence of perforation or abscess. The appendix courses posteriorly as the tip is located just superior to the uterine fundus right of midline.  Pelvic images demonstrate a small amount of fluid in the cul-de-sac. Uterus nor ovaries are unremarkable. Remaining bones and soft tissues are within normal.  IMPRESSION: Appendix slightly enlarged measuring 9 mm in diameter with mild mucosal enhancement. No significant adjacent  inflammatory change. Minimal free fluid in the pelvis. Findings are concerning for early acute appendicitis.  Mild pericholecystic fluid versus gallbladder wall thickening. Consider right upper quadrant ultrasound for better evaluation if clinically indicated.  Critical Value/emergent results were called by telephone at the time of interpretation on 08/26/2013 at 10:12 AM to Dr. Rolland Porter , who verbally acknowledged these results.   Electronically Signed   By: Elberta Fortis M.D.   On: 08/26/2013 10:12   MDM   Final diagnoses:  Appendicitis    Abdominal pain with vomiting of uncertain cause. IV hydration his initiated and she is given morphine for pain and ondansetron for nausea. Laboratory workup is initiated.  Moderate leukocytosis is noted. She has required several doses of Morphine for pain control. CT of abdomen is ordered. Case is signed out to Dr. Fayrene Fearing.   Dione Booze, MD 08/27/13 [phone]

## 2013-08-26 NOTE — ED Provider Notes (Signed)
Pt discussed with Dr. Preston FleetingGlick at shift change.  Pt with Lower Abdominal pain. WBC 14K.  On re-exam, pt ambulating from bathroom with hand on RLQ and antalgic gait... TTP Suprapubic abdomen and RLQ.  CT called to my by radiologist shows 719mm/enlarged appendix with mucosal inflamation, but minimal surrounding iinflamation. Discussed with CCS.  Rolland PorterMark Wahneta Derocher, MD 08/26/13 1034

## 2013-08-26 NOTE — Op Note (Signed)
08/26/2013  2:18 PM  PATIENT:  Sophia [name]  23 y.o. female  PRE-OPERATIVE DIAGNOSIS:  Acute appendicitis  POST-OPERATIVE DIAGNOSIS:  Acute non-perforated appendicitis  PROCEDURE:  Procedure(s): APPENDECTOMY LAPAROSCOPIC (N/A)  SURGEON:  Surgeon(s) and Role:    * Axel FillerArmando Shalimar Mcclain, MD - Primary  PHYSICIAN ASSISTANT:   ASSISTANTS: none   ANESTHESIA:   general  EBL:  Total I/O In: 1500 [I.V.:1500] Out: 350 [Urine:350]  BLOOD ADMINISTERED:none  DRAINS: none   LOCAL MEDICATIONS USED:  BUPIVICAINE   SPECIMEN:  Source of Specimen:  Appendix   DISPOSITION OF SPECIMEN:  PATHOLOGY  COUNTS:  YES  TOURNIQUET:  * No tourniquets in log *  DICTATION: .Dragon Dictation  Indications for procedure:  The patient is a [age]-year-old female with a history of periumbilical pain localized in the right lower quadrant patient had a CT scan which revealed signs consistent with acute appendicitis the patient back in for laparoscopic appendectomy.  Details of the procedure:The patient was taken back to the operating room. The patient was placed in supine position with bilateral SCDs in place. After appropriate anitbiotics were confirmed, a time-out was confirmed and all facts were verified.  A pneumoperitoneum of 14 mmHg was obtained via a Veress needle technique in the left lower quadrant quadrant.  A 5 mm trocar and 5 mm camera then placed intra-abdominally there is no injury to any intra-abdominal organs a 10 mm infraumbilical port was placed and direct visualization as was a 5 mm port in the suprapubic area. The appendix was identified  The appendix identified and cleaned down to the appendiceal base. The appendiceal artery was taken with Bovie cautery maintaining hemostasis, the mesoappendix was then incised.  The the appendiceal base was clean.  At this time an Endoloop was placed proximallyx2 and one distally and the appendix was transected between these 2. The latex retieval bag was then  placed into the abdomen and the specimen placed into the bag. The bag was removed from the abdomen.  The appendiceal stump was cauterized. We evacuate the fluid from the pelvis until the effluent was clear. The omentum was brought over the appendiceal stump. The appendix a latex retrieval  bag was then retrieved via the supraumbilical port. #1 Vicryl was used to reapproximate the fascia at the umbilical port site x1. The skin was reapproximated all port sites 3-0 Monocryl subcuticular fashion. The skin was dressed with Steri-Strips gauze and tape. The patient was awakened from general anesthesia was taken to recovery room in stable condition.       PLAN OF CARE: Admit for overnight observation  PATIENT DISPOSITION:  PACU - hemodynamically stable.   Delay start of Pharmacological VTE agent (>24hrs) due to surgical blood loss or risk of bleeding: no

## 2013-08-26 NOTE — ED Notes (Signed)
Pt returns from ct scan. 

## 2013-08-26 NOTE — ED Notes (Signed)
Introduced self to patient.  She reports she continues to have abdominal pain 6/10.  She did drink one cup of contrast but had emesis post.  She denies nausea at present.  Patient reports onset of abd pain at 12 last night.  She reports normal bm yesterday.  She reports her last period was 4 days ago.  Started brown but then returned to normal color. Patient denies any pain when voiding.  Abdomen is soft but slightly distended.  Bowel sounds are present but hypoactive.  Patient Iv remains intact to the left ac

## 2013-08-26 NOTE — ED Notes (Signed)
Patient has returned from CT at this time.  She reports she is feeling better.  Pain is now  4/10.  No further n/v

## 2013-08-26 NOTE — ED Notes (Signed)
Dr. Nancie Neasamierez at bedside.

## 2013-08-26 NOTE — H&P (Signed)
I have seen and examined the pt and agree with PA-Riebock's H&P note. Early acute appendicitis To OR All risks and benefits were d/w teh patient to include but not limited to: infection, bleeding, damage to surrounding structures, ileus, abscess formation, or wound healing complications.  The pt voiced understanding and wished to proceed.

## 2013-08-26 NOTE — Transfer of Care (Signed)
Immediate Anesthesia Transfer of Care Note  Patient: Sophia [name]  Procedure(s) Performed: Procedure(s): APPENDECTOMY LAPAROSCOPIC (N/A)  Patient Location: PACU  Anesthesia Type:General  Level of Consciousness: oriented, sedated and patient cooperative  Airway & Oxygen Therapy: Patient Spontanous Breathing and Patient connected to nasal cannula oxygen  Post-op Assessment: Report given to PACU RN and Post -op Vital signs reviewed and stable  Post vital signs: Reviewed  Complications: No apparent anesthesia complications

## 2013-08-26 NOTE — Anesthesia Preprocedure Evaluation (Addendum)
Anesthesia Evaluation  Patient identified by MRN, date of birth, ID band Patient awake    Reviewed: Allergy & Precautions, H&P , NPO status , Patient's Chart, lab work & pertinent test results  Airway Mallampati: I TM Distance: >3 FB Neck ROM: Full    Dental  (+) Teeth Intact, Dental Advisory Given   Pulmonary          Cardiovascular     Neuro/Psych    GI/Hepatic   Endo/Other    Renal/GU      Musculoskeletal   Abdominal   Peds  Hematology   Anesthesia Other Findings   Reproductive/Obstetrics                          Anesthesia Physical Anesthesia Plan  ASA: II  Anesthesia Plan: General   Post-op Pain Management:    Induction: Intravenous, Rapid sequence and Cricoid pressure planned  Airway Management Planned: Oral ETT  Additional Equipment:   Intra-op Plan:   Post-operative Plan: Extubation in OR  Informed Consent: I have reviewed the patients History and Physical, chart, labs and discussed the procedure including the risks, benefits and alternatives for the proposed anesthesia with the patient or authorized representative who has indicated his/her understanding and acceptance.     Plan Discussed with: CRNA and Surgeon  Anesthesia Plan Comments:         Anesthesia Quick Evaluation

## 2013-08-26 NOTE — Anesthesia Procedure Notes (Signed)
Procedure Name: Intubation Date/Time: 08/26/2013 1:10 PM Performed by: Alanda AmassFRIEDMAN, Alexie Lanni A Pre-anesthesia Checklist: Patient identified, Timeout performed, Emergency Drugs available, Suction available and Patient being monitored Patient Re-evaluated:Patient Re-evaluated prior to inductionOxygen Delivery Method: Circle system utilized Preoxygenation: Pre-oxygenation with 100% oxygen Intubation Type: IV induction, Rapid sequence and Cricoid Pressure applied Laryngoscope Size: Mac and 3 Grade View: Grade II Tube type: Oral Tube size: 7.0 mm Number of attempts: 1 Airway Equipment and Method: Stylet Placement Confirmation: ETT inserted through vocal cords under direct vision,  breath sounds checked- equal and bilateral and positive ETCO2 Secured at: 20 cm Tube secured with: Tape Dental Injury: Teeth and Oropharynx as per pre-operative assessment

## 2013-08-26 NOTE — Anesthesia Postprocedure Evaluation (Signed)
Anesthesia Post Note  Patient: Ginette PitmanLizbeth Pearman  Procedure(s) Performed: Procedure(s) (LRB): APPENDECTOMY LAPAROSCOPIC (N/A)  Anesthesia type: general  Patient location: PACU  Post pain: Pain level controlled  Post assessment: Patient's Cardiovascular Status Stable  Last Vitals:  Filed Vitals:   08/26/13 1500  BP: 96/62  Pulse:   Temp:   Resp:     Post vital signs: Reviewed and stable  Level of consciousness: sedated  Complications: No apparent anesthesia complications

## 2013-08-26 NOTE — H&P (Signed)
Chief Complaint: abdominal pain HPI: Sophia [name] is a healthy 23 year old female who presents with abdominal pain.  Duration of symptoms 11 hours.  Onset was sudden.  Coarse is improved. Initially severe in severity, now improved.  characterized as sharp pain.  Time pattern is constant. No aggravating or alleviating factors.  No modifying factors.  Location is periumbilical region without radiation. Associated with vomiting.  denies fever or chills.  Denies dysuria.  Last oral intake was 10PM last night.  Denies previous surgeries.    Past Medical History  Diagnosis Date  . Ectopic pregnancy 10/2011  . Miscarriage 01/2012    History reviewed. No pertinent past surgical history.  Family History  Problem Relation Age of Onset  . Diabetes Mother   . ADD / ADHD Brother   . Diabetes Maternal Grandmother    Social History:  reports that she has never smoked. She has never used smokeless tobacco. She reports that she drinks alcohol. She reports that she does not use illicit drugs.  Allergies: No Known Allergies  Medication: none  (Not in a hospital admission)  Results for orders placed during the hospital encounter of 08/26/13 (from the past 48 hour(s))  CBC WITH DIFFERENTIAL     Status: Abnormal   Collection Time    08/26/13  4:31 AM      Result Value Ref Range   WBC 14.8 (*) 4.0 - 10.5 K/uL   RBC 4.05  3.87 - 5.11 MIL/uL   Hemoglobin 13.1  12.0 - 15.0 g/dL   HCT 36.4  36.0 - 46.0 %   MCV 89.9  78.0 - 100.0 fL   MCH 32.3  26.0 - 34.0 pg   MCHC 36.0  30.0 - 36.0 g/dL   RDW 12.4  11.5 - 15.5 %   Platelets 235  150 - 400 K/uL   Neutrophils Relative % 83 (*) 43 - 77 %   Neutro Abs 12.2 (*) 1.7 - 7.7 K/uL   Lymphocytes Relative 11 (*) 12 - 46 %   Lymphs Abs 1.7  0.7 - 4.0 K/uL   Monocytes Relative 5  3 - 12 %   Monocytes Absolute 0.8  0.1 - 1.0 K/uL   Eosinophils Relative 1  0 - 5 %   Eosinophils Absolute 0.1  0.0 - 0.7 K/uL   Basophils Relative 0  0 - 1 %   Basophils  Absolute 0.0  0.0 - 0.1 K/uL  COMPREHENSIVE METABOLIC PANEL     Status: Abnormal   Collection Time    08/26/13  4:31 AM      Result Value Ref Range   Sodium 139  137 - 147 mEq/L   Potassium 3.7  3.7 - 5.3 mEq/L   Chloride 102  96 - 112 mEq/L   CO2 22  19 - 32 mEq/L   Glucose, Bld 116 (*) 70 - 99 mg/dL   BUN 15  6 - 23 mg/dL   Creatinine, Ser 0.68  0.50 - 1.10 mg/dL   Calcium 9.1  8.4 - 10.5 mg/dL   Total Protein 7.4  6.0 - 8.3 g/dL   Albumin 4.0  3.5 - 5.2 g/dL   AST 26  0 - 37 U/L   ALT 27  0 - 35 U/L   Alkaline Phosphatase 53  39 - 117 U/L   Total Bilirubin 1.0  0.3 - 1.2 mg/dL   GFR calc non Af Amer >90  >90 mL/min   GFR calc Af Amer >90  >90 mL/min  Comment: (NOTE)     The eGFR has been calculated using the CKD EPI equation.     This calculation has not been validated in all clinical situations.     eGFR's persistently <90 mL/min signify possible Chronic Kidney     Disease.  LIPASE, BLOOD     Status: None   Collection Time    08/26/13  4:31 AM      Result Value Ref Range   Lipase 55  11 - 59 U/L  URINALYSIS, ROUTINE W REFLEX MICROSCOPIC     Status: Abnormal   Collection Time    08/26/13  6:27 AM      Result Value Ref Range   Color, Urine YELLOW  YELLOW   APPearance CLEAR  CLEAR   Specific Gravity, Urine 1.024  1.005 - 1.030   pH 6.0  5.0 - 8.0   Glucose, UA NEGATIVE  NEGATIVE mg/dL   Hgb urine dipstick NEGATIVE  NEGATIVE   Bilirubin Urine NEGATIVE  NEGATIVE   Ketones, ur 40 (*) NEGATIVE mg/dL   Protein, ur NEGATIVE  NEGATIVE mg/dL   Urobilinogen, UA 0.2  0.0 - 1.0 mg/dL   Nitrite NEGATIVE  NEGATIVE   Leukocytes, UA NEGATIVE  NEGATIVE   Comment: MICROSCOPIC NOT DONE ON URINES WITH NEGATIVE PROTEIN, BLOOD, LEUKOCYTES, NITRITE, OR GLUCOSE <1000 mg/dL.  POCT PREGNANCY, URINE     Status: None   Collection Time    08/26/13  6:34 AM      Result Value Ref Range   Preg Test, Ur NEGATIVE  NEGATIVE   Comment:            THE SENSITIVITY OF THIS     METHODOLOGY IS >24  mIU/mL   Ct Abdomen Pelvis W Contrast  08/26/2013   CLINICAL DATA:  Abdominal pain which is generalized with vomiting.  EXAM: CT ABDOMEN AND PELVIS WITH CONTRAST  TECHNIQUE: Multidetector CT imaging of the abdomen and pelvis was performed using the standard protocol following bolus administration of intravenous contrast.  CONTRAST:  167m OMNIPAQUE IOHEXOL 300 MG/ML  SOLN  COMPARISON:  None.  FINDINGS: Lung bases are within normal.  Abdominal images demonstrate a normal liver, spleen, pancreas and adrenal glands. There is no evidence of cholelithiasis. There is a small amount of pericholecystic fluid versus gallbladder wall thickening. Kidneys an ureters are normal.  The appendix measures 9 mm in diameter with mild mucosal enhancement. There is no significant adjacent inflammation or free fluid. No evidence of perforation or abscess. The appendix courses posteriorly as the tip is located just superior to the uterine fundus right of midline.  Pelvic images demonstrate a small amount of fluid in the cul-de-sac. Uterus nor ovaries are unremarkable. Remaining bones and soft tissues are within normal.  IMPRESSION: Appendix slightly enlarged measuring 9 mm in diameter with mild mucosal enhancement. No significant adjacent inflammatory change. Minimal free fluid in the pelvis. Findings are concerning for early acute appendicitis.  Mild pericholecystic fluid versus gallbladder wall thickening. Consider right upper quadrant ultrasound for better evaluation if clinically indicated.  Critical Value/emergent results were called by telephone at the time of interpretation on 08/26/2013 at 10:12 AM to Dr. MTanna Furry, who verbally acknowledged these results.   Electronically Signed   By: DMarin OlpM.D.   On: 08/26/2013 10:12    Review of Systems  Constitutional: Negative.   HENT: Negative.   Eyes: Negative.   Respiratory: Negative.   Cardiovascular: Negative.   Gastrointestinal: Positive for nausea, vomiting and  abdominal  pain. Negative for diarrhea, constipation, blood in stool and melena.  Genitourinary: Negative.   Musculoskeletal: Negative.   Skin: Negative.   Neurological: Negative.   Psychiatric/Behavioral: Negative.     Blood pressure 100/52, pulse 68, temperature 97.8 F (36.6 C), temperature source Oral, resp. rate 20, height 5' 1"  (1.549 m), weight 56.7 kg (125 lb), last menstrual period 08/23/2013, SpO2 98.00%. Physical Exam  Constitutional: She is oriented to person, place, and time. She appears well-developed and well-nourished. No distress.  Cardiovascular: Normal rate, regular rhythm, normal heart sounds and intact distal pulses.   Respiratory: Effort normal and breath sounds normal.  GI: Soft. Bowel sounds are normal. She exhibits no distension and no mass. There is no rebound and no guarding.  Mild ttp periumbilical region, moderate to RLQ, no TTP to RUQ  Musculoskeletal: She exhibits no edema and no tenderness.  Neurological: She is alert and oriented to person, place, and time.  Skin: Skin is warm and dry. No rash noted. She is not diaphoretic. No erythema. No pallor.  Psychiatric: She has a normal mood and affect. Her behavior is normal. Judgment and thought content normal.     Assessment/Plan Acute early appendicitis -admit to observation, proceed with laparoscopic appendectomy now. -IV antibiotics: zosyn -NPO, IV hydration -VTE prophylaxis: SCDs, lovenox -pain control -anti-emetics -we discussed surgery risks and complications including infection, bleeding, injury to surrounding organs.  She verbalizes understanding and wishes to proceed.  -obtain consent  Stanislawa Gaffin ANP-BC 08/26/2013, 11:23 AM

## 2013-08-26 NOTE — ED Notes (Signed)
The pt is c/o generalized abd pain since [id number]  With nv.. lmp  4 days ago

## 2013-08-26 NOTE — ED Notes (Signed)
General surgery at bedside. 

## 2013-08-26 NOTE — ED Notes (Signed)
Provider notified of need for more pain and nausea medication.

## 2013-08-26 NOTE — ED Notes (Signed)
Patient transported to CT 

## 2013-08-27 MED ORDER — HYDROCODONE-ACETAMINOPHEN 5-325 MG PO TABS: 1.0000 | ORAL_TABLET | ORAL | Status: DC | PRN

## 2013-08-27 NOTE — Progress Notes (Signed)
Discharge instructions reviewed with patient, questions answered, verbalized understanding.  Did give patient number to financial counselor as she is concerned that she does not have insurance.  Written prescription given for pain medication as well as work note given.  Patient transported to front of hospital via wheelchair to be taken home by family.  Patient in good condition upon leaving [facility].

## 2013-08-27 NOTE — Discharge Summary (Signed)
Patient ID: Sophia [name] MRN: [id number] DOB/AGE: 23/08/1990 22 y.o.  Admit date: 08/26/2013 Discharge date: 08/27/2013  Procedures: lap appy  Consults: None  Reason for Admission: Sophia [name] is a healthy 23 year old female who presents with abdominal pain. Duration of symptoms 11 hours. Onset was sudden. Coarse is improved. Initially severe in severity, now improved. characterized as sharp pain. Time pattern is constant. No aggravating or alleviating factors. No modifying factors. Location is periumbilical region without radiation. Associated with vomiting. denies fever or chills. Denies dysuria. Last oral intake was 10PM last night. Denies previous surgeries.   Admission Diagnoses:  1. Acute appendicitis  Hospital Course: The patient was admitted and taken to the OR where she underwent a lap appy.  She tolerated this procedure well.  On POD 1, she was tolerating a regular diet and her pain was well controlled.  She was stable for dc home.  PE: Abd: soft, appropriately tender, +BS, ND, incisions c/d/i with no drainage on her bandage  Discharge Diagnoses:  Active Problems:   Acute appendicitis s/p lap appy  Discharge Medications:   Medication List         HYDROcodone-acetaminophen 5-325 MG per tablet  Commonly known as:  NORCO/VICODIN  Take 1-2 tablets by mouth every 4 (four) hours as needed for moderate pain or severe pain.        Discharge Instructions:     Follow-up Information   Follow up with [facility] On 09/18/2013. (1:30pm, arrive at 1:00pm for paperwork)    Contact information:   [address]   [place] [address] [phone]       Signed: Letha CapeOSBORNE,Hoang Reich E 08/27/2013, 8:57 AM

## 2013-08-27 NOTE — Discharge Summary (Signed)
Colin Ellers M. Shelton Soler, MD, FACS General, Bariatric, & Minimally Invasive Surgery [facility]  

## 2013-08-27 NOTE — Discharge Instructions (Signed)
[facility] ______[facility] °LAPAROSCOPIC SURGERY: POST OP INSTRUCTIONS °Always review your discharge instruction sheet given to you by the facility where your surgery was performed. °IF YOU HAVE DISABILITY OR FAMILY LEAVE FORMS, YOU MUST BRING THEM TO THE OFFICE FOR PROCESSING.   °DO NOT GIVE THEM TO YOUR DOCTOR. ° °1. A prescription for pain medication may be given to you upon discharge.  Take your pain medication as prescribed, if needed.  If narcotic pain medicine is not needed, then you may take acetaminophen (Tylenol) or ibuprofen (Advil) as needed. °2. Take your usually prescribed medications unless otherwise directed. °3. If you need a refill on your pain medication, please contact your pharmacy.  They will contact our office to request authorization. Prescriptions will not be filled after 5pm or on week-ends. °4. You should follow a light diet the first few days after arrival home, such as soup and crackers, etc.  Be sure to include lots of fluids daily. °5. Most patients will experience some swelling and bruising in the area of the incisions.  Ice packs will help.  Swelling and bruising can take several days to resolve.  °6. It is common to experience some constipation if taking pain medication after surgery.  Increasing fluid intake and taking a stool softener (such as Colace) will usually help or prevent this problem from occurring.  A mild laxative (Milk of Magnesia or Miralax) should be taken according to package instructions if there are no bowel movements after 48 hours. °7. Unless discharge instructions indicate otherwise, you may remove your bandages 24-48 hours after surgery, and you may shower at that time.  You may have steri-strips (small skin tapes) in place directly over the incision.  These strips should be left on the skin for 7-10 days.  If your surgeon used skin glue on the incision, you may shower in 24 hours.  The glue will flake off over the next 2-3 weeks.  Any sutures or  staples will be removed at the office during your follow-up visit. °8. ACTIVITIES:  You may resume regular (light) daily activities beginning the next day--such as daily self-care, walking, climbing stairs--gradually increasing activities as tolerated.  You may have sexual intercourse when it is comfortable.  Refrain from any heavy lifting or straining until approved by your doctor. °a. You may drive when you are no longer taking prescription pain medication, you can comfortably wear a seatbelt, and you can safely maneuver your car and apply brakes. °b. RETURN TO WORK:  __________________________________________________________ °9. You should see your doctor in the office for a follow-up appointment approximately 2-3 weeks after your surgery.  Make sure that you call for this appointment within a day or two after you arrive home to insure a convenient appointment time. °10. OTHER INSTRUCTIONS: __________________________________________________________________________________________________________________________ __________________________________________________________________________________________________________________________ °WHEN TO CALL YOUR DOCTOR: °1. Fever over 101.0 °2. Inability to urinate °3. Continued bleeding from incision. °4. Increased pain, redness, or drainage from the incision. °5. Increasing abdominal pain ° °The clinic staff is available to answer your questions during regular business hours.  Please don’t hesitate to call and ask to speak to one of the nurses for clinical concerns.  If you have a medical emergency, go to the nearest emergency room or call 911.  A surgeon from [facility] is always on call at the hospital. °[address], [address], [place], [place]  [address] ? [address] [address], [place], [place]   [address] °[phone] ? [phone] ? FAX [phone] °Web site:   [email or web address] °

## 2013-08-28 ENCOUNTER — Encounter (HOSPITAL_COMMUNITY): Payer: Self-pay | Admitting: General Surgery

## 2013-09-18 ENCOUNTER — Encounter (INDEPENDENT_AMBULATORY_CARE_PROVIDER_SITE_OTHER): Payer: Self-pay

## 2013-09-25 ENCOUNTER — Encounter (INDEPENDENT_AMBULATORY_CARE_PROVIDER_SITE_OTHER): Payer: Self-pay

## 2013-10-18 ENCOUNTER — Ambulatory Visit: Payer: PRIVATE HEALTH INSURANCE | Admitting: Physician Assistant

## 2013-11-29 ENCOUNTER — Ambulatory Visit (INDEPENDENT_AMBULATORY_CARE_PROVIDER_SITE_OTHER): Payer: 59

## 2013-12-28 ENCOUNTER — Ambulatory Visit (INDEPENDENT_AMBULATORY_CARE_PROVIDER_SITE_OTHER): Payer: 59 | Admitting: Family Medicine

## 2013-12-28 VITALS — BP 100/64 | HR 69 | Temp 97.5°F | Resp 16 | Ht 60.5 in | Wt 121.8 lb

## 2013-12-28 DIAGNOSIS — R05 Cough: Secondary | ICD-10-CM

## 2013-12-28 DIAGNOSIS — R059 Cough, unspecified: Secondary | ICD-10-CM

## 2013-12-28 DIAGNOSIS — B353 Tinea pedis: Secondary | ICD-10-CM

## 2013-12-28 DIAGNOSIS — Z8742 Personal history of other diseases of the female genital tract: Secondary | ICD-10-CM

## 2013-12-28 MED ORDER — BENZONATATE 100 MG PO CAPS: 100.0000 mg | ORAL_CAPSULE | Freq: Three times a day (TID) | ORAL | Status: DC | PRN

## 2013-12-28 MED ORDER — AMOXICILLIN 875 MG PO TABS: 875.0000 mg | ORAL_TABLET | Freq: Two times a day (BID) | ORAL | Status: DC

## 2013-12-28 NOTE — Addendum Note (Signed)
Addended by: Nita SellsSMITH, LATORIA S on: 12/28/2013 10:12 AM   Modules accepted: Orders

## 2013-12-28 NOTE — Progress Notes (Signed)
Subjective: Patient is here for several things. She has a respiratory tract infection for the last 4 days it's been causing her a bad cough. She does not smoke. She is coughing a lot at night. She has had congestion and mostly clear phlegm.  She has a rash on her feet which is concerning her  Her CT of the abdomen which he had appendicitis showed some fluid around the gallbladder inch and she was told this might need to be followed up on.  She had an abnormal Pap smear and is here for six-month recheck of that. She's had one partner for the last 2 years. Uses patches for contraception.  Objective: No acute distress. Does have a cough. TMs are normal. Throat clear. Neck supple without nodes. Chest clear. Heart regular without murmurs. Abdomen soft without masses or tenderness. Pelvic normal external genitalia. Vaginal mucosa unremarkable. Cervix appears very benign. Pap was taken.  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

## 2013-12-28 NOTE — Patient Instructions (Signed)
Use over-the-counter Lamisil cream once or twice daily on heels for 2 weeks  If you do not hear from us regarding the past test within the next 10 days call back  Amoxicillin 875 one twice daily  Tessalon pearls one every 6-8 hours as needed for cough  Repeat Pap again in 6 months

## 2014-01-01 LAB — PAP IG W/ RFLX HPV ASCU

## 2014-02-23 ENCOUNTER — Emergency Department (HOSPITAL_COMMUNITY): Payer: 59

## 2014-02-23 ENCOUNTER — Emergency Department (HOSPITAL_COMMUNITY)
Admission: EM | Admit: 2014-02-23 | Discharge: 2014-02-23 | Disposition: A | Discharge status: Home / Self Care | Payer: 59 | Attending: Emergency Medicine | Admitting: Emergency Medicine

## 2014-02-23 ENCOUNTER — Encounter (HOSPITAL_COMMUNITY): Payer: Self-pay | Admitting: Emergency Medicine

## 2014-02-23 DIAGNOSIS — W1809XA Striking against other object with subsequent fall, initial encounter: Secondary | ICD-10-CM | POA: Insufficient documentation

## 2014-02-23 DIAGNOSIS — S1093XA Contusion of unspecified part of neck, initial encounter: Secondary | ICD-10-CM

## 2014-02-23 DIAGNOSIS — S0083XA Contusion of other part of head, initial encounter: Secondary | ICD-10-CM | POA: Insufficient documentation

## 2014-02-23 DIAGNOSIS — S0003XA Contusion of scalp, initial encounter: Secondary | ICD-10-CM | POA: Insufficient documentation

## 2014-02-23 DIAGNOSIS — Y939 Activity, unspecified: Secondary | ICD-10-CM | POA: Insufficient documentation

## 2014-02-23 DIAGNOSIS — S0180XA Unspecified open wound of other part of head, initial encounter: Secondary | ICD-10-CM | POA: Diagnosis present

## 2014-02-23 DIAGNOSIS — W19XXXA Unspecified fall, initial encounter: Secondary | ICD-10-CM

## 2014-02-23 DIAGNOSIS — Y929 Unspecified place or not applicable: Secondary | ICD-10-CM | POA: Diagnosis not present

## 2014-02-23 DIAGNOSIS — S01112A Laceration without foreign body of left eyelid and periocular area, initial encounter: Secondary | ICD-10-CM

## 2014-02-23 DIAGNOSIS — S0012XA Contusion of left eyelid and periocular area, initial encounter: Secondary | ICD-10-CM

## 2014-02-23 MED ORDER — LIDOCAINE HCL (PF) 1 % IJ SOLN
5.0000 mL | Freq: Once | INTRAMUSCULAR | Status: AC
  Administered 2014-02-23: 5 mL via INTRADERMAL
  Filled 2014-02-23: qty 5

## 2014-02-23 MED ORDER — HYDROCODONE-ACETAMINOPHEN 5-325 MG PO TABS
2.0000 | ORAL_TABLET | Freq: Once | ORAL | Status: AC
  Administered 2014-02-23: 2 via ORAL
  Filled 2014-02-23: qty 2

## 2014-02-23 MED ORDER — LIDOCAINE HCL 1 % IJ SOLN
INTRAMUSCULAR | Status: AC
  Filled 2014-02-23: qty 20

## 2014-02-23 MED ORDER — TRAMADOL HCL 50 MG PO TABS: 50.0000 mg | ORAL_TABLET | Freq: Four times a day (QID) | ORAL | Status: DC | PRN

## 2014-02-23 NOTE — Discharge Instructions (Signed)
Take tylenol or ibuprofen as needed for pain. Refer to attached documents for more information.  °

## 2014-02-23 NOTE — ED Notes (Signed)
She states she slipped/tripped in her bathtub and fell forward, striking her head on the commode.  She has a lac. With surrounding edema/erythema at lat. Superior orbit area.  She is alert and oriented x 4 with clear speech.  The lac. Is open and is ~1.5cm in length.

## 2014-02-23 NOTE — ED Provider Notes (Signed)
Medical screening examination/treatment/procedure(s) were performed by non-physician practitioner and as supervising physician I was immediately available for consultation/collaboration.   EKG Interpretation None       Doug SouSam Elvin Banker, MD 02/23/14 2325

## 2014-02-23 NOTE — ED Provider Notes (Signed)
CSN: [id number]     Arrival date & time 02/23/14  1436 History  This chart was scribed for non-physician practitioner, Emilia Beck, PA-C, working with Doug Sou, MD, by Bronson Curb, ED Scribe. This patient was seen in room WTR9/WTR9 and the patient's care was started at 3:38 PM.    Chief Complaint  Patient presents with  . Facial Laceration    Patient is a 23 y.o. female presenting with skin laceration. The history is provided by the patient. No language interpreter was used.  Laceration Location:  Head/neck Head/neck laceration location:  Head Bleeding: controlled   Laceration mechanism:  Fall Foreign body present:  No foreign bodies Relieved by:  None tried Worsened by:  Nothing tried Ineffective treatments:  None tried   HPI Comments: Sophia [name] is a 23 y.o. female who presents to the Emergency Department complaining of facial laceration to the left eye brow that occurred PTA. Patient states she slipped in her bathtub and fell forward, striking her head on the toilet. She denies LOC. There is associated controlled bleeding, swelling, and redness. She denies any other injuries. Patient is UTD on tetanus.  Past Medical History  Diagnosis Date  . Ectopic pregnancy 10/2011  . Miscarriage 01/2012   Past Surgical History  Procedure Laterality Date  . Laparoscopic appendectomy N/A 08/26/2013    Procedure: APPENDECTOMY LAPAROSCOPIC;  Surgeon: Axel Filler, MD;  Location: [facility] OR;  Service: General;  Laterality: N/A;   Family History  Problem Relation Age of Onset  . Diabetes Mother   . ADD / ADHD Brother   . Diabetes Maternal Grandmother    History  Substance Use Topics  . Smoking status: Never Smoker   . Smokeless tobacco: Never Used  . Alcohol Use: 0.6 oz/week    1 Glasses of wine per week     Comment: occasional   OB History   Grav Para Term Preterm Abortions TAB SAB Ect Mult Living                 Review of Systems  Constitutional: Negative  for fever and chills.  Skin: Positive for wound.  All other systems reviewed and are negative.     Allergies  Review of patient's allergies indicates no known allergies.  Home Medications   Prior to Admission medications   Medication Sig Start Date End Date Taking? Authorizing Provider  ibuprofen (ADVIL,MOTRIN) 200 MG tablet Take 400 mg by mouth every 6 (six) hours as needed for mild pain or moderate pain.   Yes Historical Provider, MD  Multiple Vitamin (MULTIVITAMIN WITH MINERALS) TABS tablet Take 1 tablet by mouth daily.   Yes Historical Provider, MD   Triage Vitals: BP 118/63  Pulse 69  Temp(Src) 98.7 F (37.1 C) (Oral)  Resp 18  SpO2 97%  LMP 02/09/2014  Physical Exam  Nursing note and vitals reviewed. Constitutional: She is oriented to person, place, and time. She appears well-developed and well-nourished. No distress.  HENT:  Head: Normocephalic and atraumatic.  Left eye brow swelling with TTP.  Eyes: Conjunctivae and EOM are normal.  Neck: Neck supple. No tracheal deviation present.  Cardiovascular: Normal rate.   Pulmonary/Chest: Effort normal. No respiratory distress.  Musculoskeletal: Normal range of motion.  Neurological: She is alert and oriented to person, place, and time.  Skin: Skin is warm and dry.  2.5 cm laceration of left eye brow. Bleeding controlled.  Psychiatric: She has a normal mood and affect. Her behavior is normal.    ED Course  Procedures (including critical care time)  DIAGNOSTIC STUDIES: Oxygen Saturation is 97% on room air, adequate by my interpretation.    COORDINATION OF CARE: At 1541 Discussed treatment plan with patient which includes CT (maxillofacial) and laceration repair. Patient agrees.    LACERATION REPAIR PROCEDURE NOTE The patient's identification was confirmed and consent was obtained. This procedure was performed by Emilia BeckKaitlyn Leialoha Hanna, PA-C at 4:58 PM. Site: left eyebrow Anesthetic used (type and amt): lidocaine 1%  without epi Suture type/size:5-0 vicryl rapide Length:2.5cm # of Sutures: 4 Technique:simple Complexitysimple Tetanus UTD or ordered: UTD Site anesthetized, irrigated with NS, explored without evidence of foreign body, wound well approximated, site covered with dry, sterile dressing.  Patient tolerated procedure well without complications. Instructions for care discussed verbally and patient provided with additional written instructions for homecare and f/u.   Labs Review Labs Reviewed - No data to display  Imaging Review Ct Maxillofacial Wo Cm  02/23/2014   CLINICAL DATA:  Status post fall with a blow to the face. Swelling and erythema about the left eye.  EXAM: CT MAXILLOFACIAL WITHOUT CONTRAST  TECHNIQUE: Multidetector CT imaging of the maxillofacial structures was performed. Multiplanar CT image reconstructions were also generated. A small metallic BB was placed on the right temple in order to reliably differentiate right from left.  COMPARISON:  None.  FINDINGS: The globes are intact and the lenses are located. There is no facial bone fracture. Orbital fat appears normal. Imaged intracranial contents appear normal. Small mucous retention cysts or polyps are seen in the maxillary sinuses, larger on the left. Tiny amount of fluid the right mastoid air cells is incidentally noted.  IMPRESSION: Contusion about the left eye without underlying fracture or other acute abnormality.  Mild maxillary sinus disease bilaterally.   Electronically Signed   By: Drusilla Kannerhomas  Dalessio M.D.   On: 02/23/2014 16:43     EKG Interpretation None      MDM   Final diagnoses:  Fall, initial encounter  Eyebrow laceration, left, initial encounter  Traumatic hematoma of left eyebrow, initial encounter    CT face shows no fractures. Laceration repaired without difficulty. Tdap up to date.   I personally performed the services described in this documentation, which was scribed in my presence. The recorded  information has been reviewed and is accurate.    Emilia BeckKaitlyn Tashi Andujo, [place]-C 02/23/14 1904

## 2015-05-26 ENCOUNTER — Ambulatory Visit (INDEPENDENT_AMBULATORY_CARE_PROVIDER_SITE_OTHER): Payer: 59 | Admitting: Family Medicine

## 2015-05-26 VITALS — BP 120/80 | HR 68 | Temp 98.1°F | Resp 18 | Ht 61.0 in | Wt 138.8 lb

## 2015-05-26 DIAGNOSIS — F329 Major depressive disorder, single episode, unspecified: Secondary | ICD-10-CM

## 2015-05-26 DIAGNOSIS — F32A Depression, unspecified: Secondary | ICD-10-CM

## 2015-05-26 DIAGNOSIS — N926 Irregular menstruation, unspecified: Secondary | ICD-10-CM | POA: Diagnosis not present

## 2015-05-26 DIAGNOSIS — Z711 Person with feared health complaint in whom no diagnosis is made: Secondary | ICD-10-CM

## 2015-05-26 DIAGNOSIS — G4700 Insomnia, unspecified: Secondary | ICD-10-CM

## 2015-05-26 LAB — POCT CBC
Granulocyte percent: 49.1 %G (ref 37–80)
HEMATOCRIT: 42.9 % (ref 37.7–47.9)
HEMOGLOBIN: 14.9 g/dL (ref 12.2–16.2)
LYMPH, POC: 2.8 (ref 0.6–3.4)
MCH, POC: 31.7 pg — AB (ref 27–31.2)
MCHC: 34.7 g/dL (ref 31.8–35.4)
MCV: 91.4 fL (ref 80–97)
MID (cbc): 0.3 (ref 0–0.9)
MPV: 9.5 fL (ref 0–99.8)
POC GRANULOCYTE: 2.9 (ref 2–6.9)
POC LYMPH PERCENT: 46 %L (ref 10–50)
POC MID %: 4.9 % (ref 0–12)
Platelet Count, POC: 232 10*3/uL (ref 142–424)
RBC: 4.7 M/uL (ref 4.04–5.48)
RDW, POC: 13 %
Result 2: 2

## 2015-05-26 LAB — POCT URINE PREGNANCY: Preg Test, Ur: NEGATIVE

## 2015-05-26 MED ORDER — LORAZEPAM 0.5 MG PO TABS: 0.5000 mg | ORAL_TABLET | Freq: Two times a day (BID) | ORAL | Status: DC | PRN

## 2015-05-26 MED ORDER — LORAZEPAM 0.5 MG PO TABS: 0.5000 mg | ORAL_TABLET | Freq: Every day | ORAL | Status: DC

## 2015-05-26 NOTE — Progress Notes (Signed)
Patient ID: Sophia [name], female    DOB: 1991-07-10  Age: 24 y.o. MRN: [id number]  Chief Complaint  Patient presents with  . OTHER    have period second time same month , STD   . Depression    see screening     Subjective:   24 year old lady who is here for several things. She has had 2 menstrual cycles already this month. The first one lasted for about a week, and she started spotting on Saturday and it is seeming like almost a regular period again. She is not having pain. She does have a history of having had a miscarriage and an ectopic in the past. She was supposed get started back on birth control pills this past weekend and did not. Her last sexual encounter was late October. She is emotionally in turmoil with finding out a few days ago that her fianc who she was to marry in January had been cheating on her. She needs to be tested for STDs because of that. She has been having insomnia problems before that. She feels like she sleeps such a shallow sleep she has been aware of everything going on to the nighttime hours.  Has had bacterial vaginosis but no true STDs.  She is from [place], lives alone, is attending [address] in [place]. She denies suicidal thinking.  Current allergies, medications, problem list, past/family and social histories reviewed.  Objective:  BP 120/80 mmHg  Pulse 68  Temp(Src) 98.1 F (36.7 C) (Oral)  Resp 18  Ht  (1.549 m)  Wt 138 lb 12.8 oz (62.959 kg)  BMI 26.24 kg/m2  SpO2 98%  LMP 05/24/2015  Healthy-appearing young lady in no major distress physically, but emotionally is upset. Abdomen soft without masses or tenderness. She is having vaginal bleeding, and in the absence of pain chose not to do a pelvic yet. She does have a gynecologist.  Assessment & Plan:   Assessment: 1. Concern about STD in female without diagnosis   2. Irregular menstrual cycle   3. Insomnia   4. Depressed       Plan: STD testing, urine  pregnancy tests, then decided to approach.  Results for orders placed or performed in visit on 05/26/15  POCT CBC  Result Value Ref Range   Result 2 2    Lymph, poc 2.8 0.6 - 3.4   POC LYMPH PERCENT 46.0 10 - 50 %L   MID (cbc) 0.3 0 - 0.9   POC MID % 4.9 0 - 12 %M   POC Granulocyte 2.9 2 - 6.9   Granulocyte percent 49.1 37 - 80 %G   RBC 4.70 4.04 - 5.48 M/uL   Hemoglobin 14.9 12.2 - 16.2 g/dL   HCT, POC 40.9 81.1 - 47.9 %   MCV 91.4 80 - 97 fL   MCH, POC 31.7 (A) 27 - 31.2 pg   MCHC 34.7 31.8 - 35.4 g/dL   RDW, POC 91.4 %   Platelet Count, POC 232 142 - 424 K/uL   MPV 9.5 0 - 99.8 fL  POCT urine pregnancy  Result Value Ref Range   Preg Test, Ur Negative Negative         Patient Instructions  Try to get regular exercise  Sleep in a cool room, dark, no electronic media  Use background quite sound at night  Take lorazepam 0.5 mg 1 at bedtime for the next few nights to try and get yourself caught up a little on sleep, then  start using it only on an as-needed basis if you have been a night or 2 without a good night sleep.  If your depression ever gets abruptly worse or having suicidal thinking go for help here or to an emergency room immediately  Advise resuming your birth control. Do not count on it for protection for the next 6 weeks.  If more bleeding problems see your gynecologist     Return if symptoms worsen or fail to improve.   Caralynn Gelber, MD 05/26/2015

## 2015-05-26 NOTE — Patient Instructions (Signed)
Try to get regular exercise  Sleep in a cool room, dark, no electronic media  Use background quite sound at night  Take lorazepam 0.5 mg 1 at bedtime for the next few nights to try and get yourself caught up a little on sleep, then start using it only on an as-needed basis if you have been a night or 2 without a good night sleep.  If your depression ever gets abruptly worse or having suicidal thinking go for help here or to an emergency room immediately  Advise resuming your birth control. Do not count on it for protection for the next 6 weeks.  If more bleeding problems see your gynecologist

## 2015-05-27 LAB — RPR

## 2015-05-27 LAB — HIV ANTIBODY (ROUTINE TESTING W REFLEX): HIV: NONREACTIVE

## 2015-05-28 LAB — GC/CHLAMYDIA PROBE AMP
CT Probe RNA: NEGATIVE
GC PROBE AMP APTIMA: NEGATIVE

## 2015-08-06 ENCOUNTER — Ambulatory Visit (INDEPENDENT_AMBULATORY_CARE_PROVIDER_SITE_OTHER): Payer: 59 | Admitting: Family Medicine

## 2015-08-06 VITALS — BP 108/72 | HR 64 | Temp 98.4°F | Resp 20 | Ht 61.0 in | Wt 135.8 lb

## 2015-08-06 DIAGNOSIS — J209 Acute bronchitis, unspecified: Secondary | ICD-10-CM

## 2015-08-06 MED ORDER — HYDROCODONE-HOMATROPINE 5-1.5 MG/5ML PO SYRP: 5.0000 mL | ORAL_SOLUTION | Freq: Three times a day (TID) | ORAL | Status: DC | PRN

## 2015-08-06 MED ORDER — AZITHROMYCIN 250 MG PO TABS: ORAL_TABLET | ORAL | Status: DC

## 2015-08-06 NOTE — Progress Notes (Signed)
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  Nonsmoker.   No asthma hx.  Objective:  NAD Chief Complaint    Cough    BP 108/72 mmHg  Pulse 64  Temp(Src) 98.4 F (36.9 C) (Oral)  Resp 20  Ht  (1.549 m)  Wt 135 lb 12.8 oz (61.598 kg)  BMI 25.67 kg/m2  SpO2 98%  LMP 07/18/2015 HEENT:  Throat mildly erythematous Chest:  Clear Heart:  Reg, no murmur  Assessment:  Bronchitis.   This chart was scribed in my presence and reviewed by me personally.    ICD-9-CM ICD-10-CM   1. Acute bronchitis, unspecified organism 466.0 J20.9 azithromycin (ZITHROMAX) 250 MG85 tablet     HYDROcodone-homatropine (HYCODAN) 5-1.5 MG/5ML syrup     Signed, Elvina Sidle, MD

## 2015-08-06 NOTE — Patient Instructions (Signed)

## 2015-08-10 IMAGING — CT CT ABD-PELV W/ CM
2 of 4 series · 17 of 46 positions shown, 19 images · IV contrast (CONTRAST)
Comparison: None.

CLINICAL DATA: Abdominal pain which is generalized with vomiting.

EXAM:
CT ABDOMEN AND PELVIS WITH CONTRAST
TECHNIQUE: Multidetector CT imaging of the abdomen and pelvis was performed
using the standard protocol following bolus administration of
intravenous contrast.
CONTRAST:  100mL OMNIPAQUE IOHEXOL 300 MG/ML  SOLN

[Series 2: routine · axial · 0.62mm/px · z∈[-592,-192]mm · 14 of 88 slices shown, 16 images]
[im 4/88  soft-tissue]
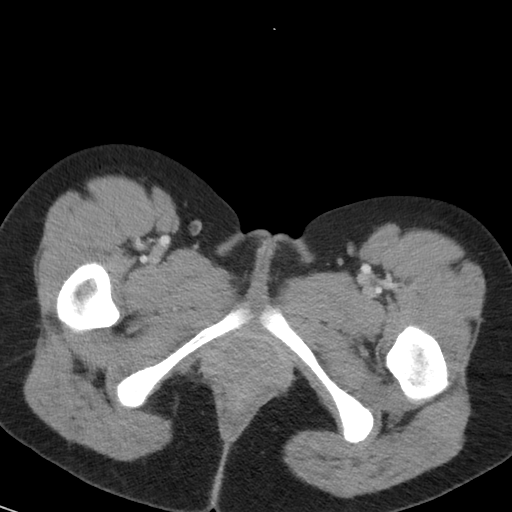
[im 4/88  bone]
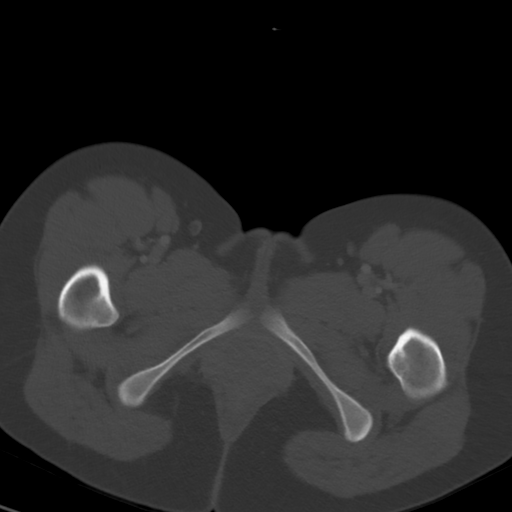
[im 11/88  soft-tissue]
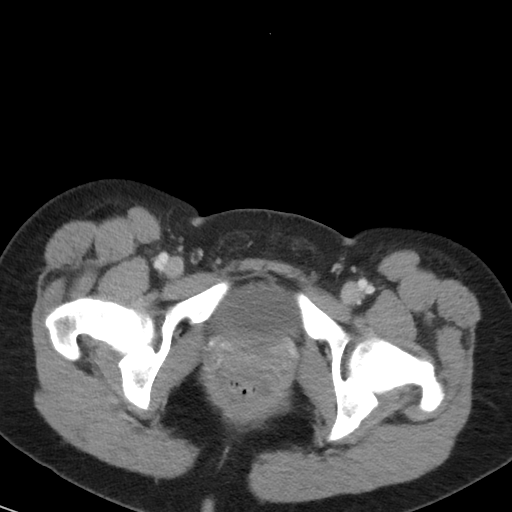
[im 19/88  soft-tissue]
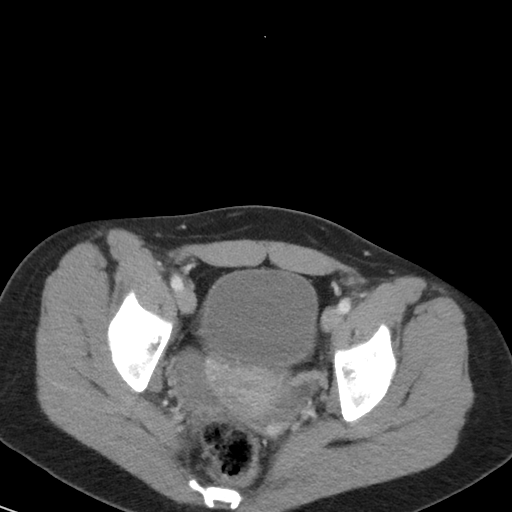
[im 22/88  soft-tissue]
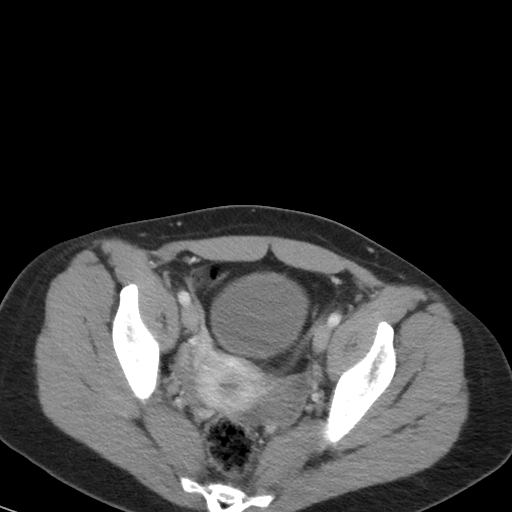
[im 30/88  soft-tissue]
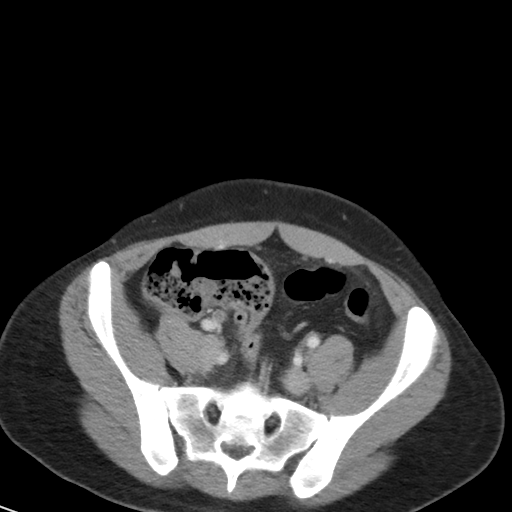
[im 37/88  soft-tissue]
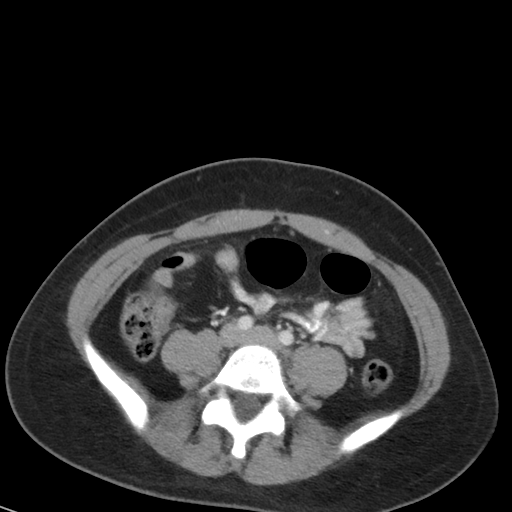
[im 40/88  soft-tissue]
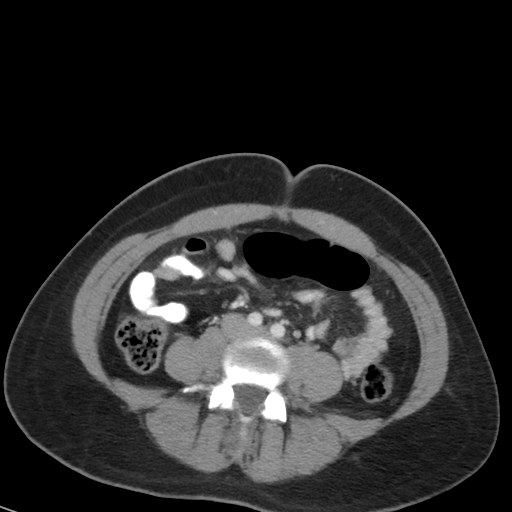
[im 48/88  soft-tissue]
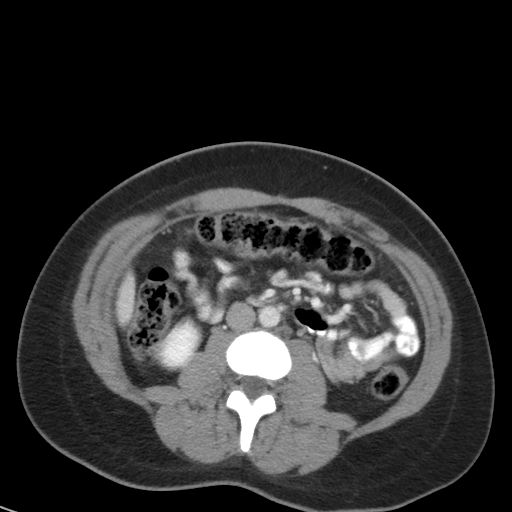
[im 51/88  soft-tissue]
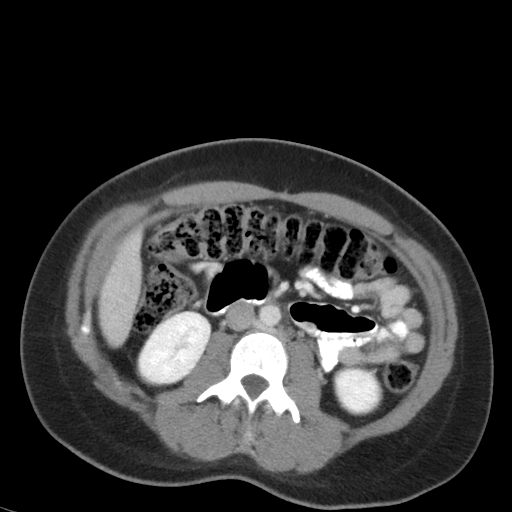
[im 51/88  bone]
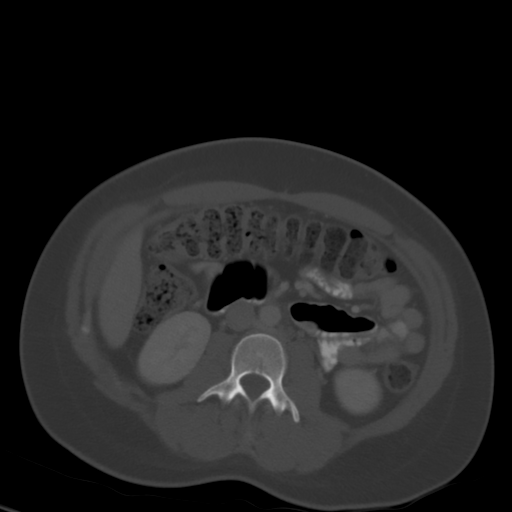
[im 59/88  soft-tissue]
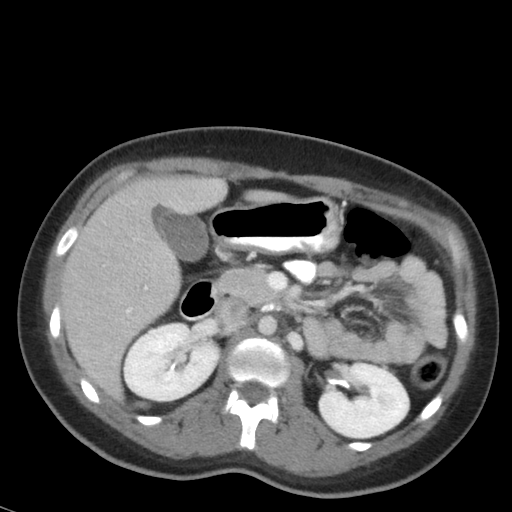
[im 66/88  soft-tissue]
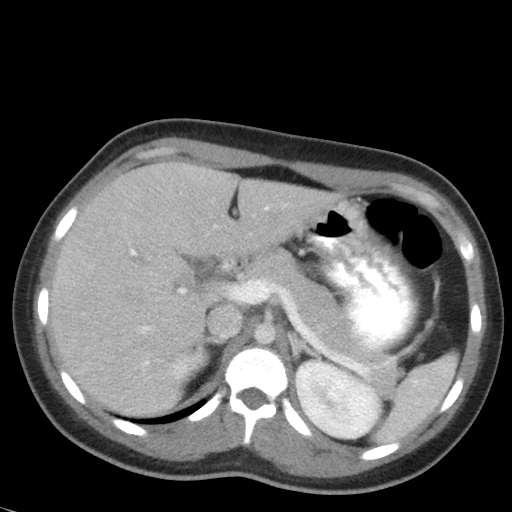
[im 69/88  soft-tissue]
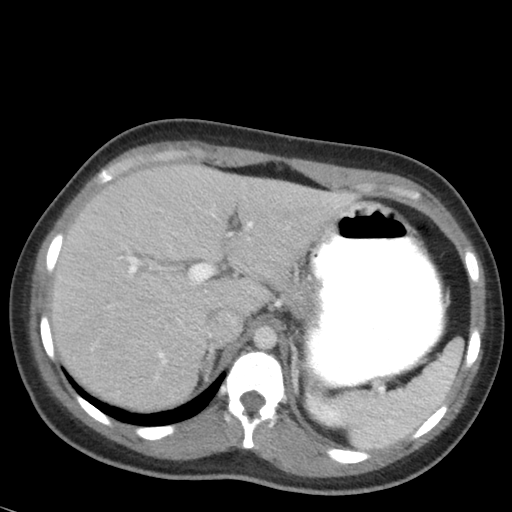
[im 77/88  soft-tissue]
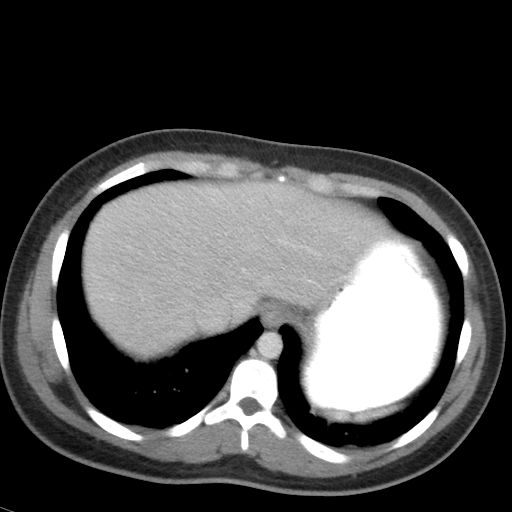
[im 84/88  soft-tissue]
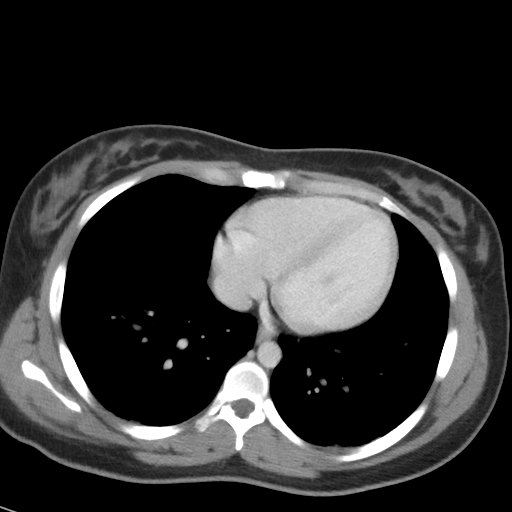

[Series 8040: coronal · coronal · 0.85mm/px · 3 of 102 slices shown]
[im 34/102  soft-tissue]
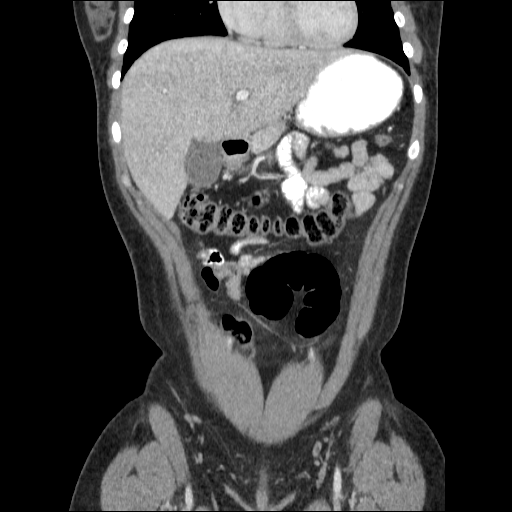
[im 45/102  soft-tissue]
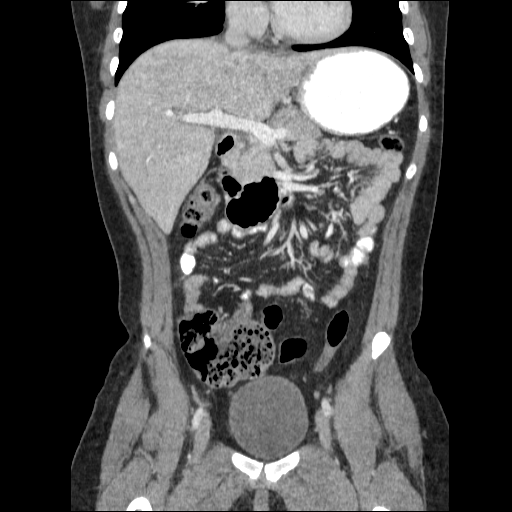
[im 57/102  soft-tissue]
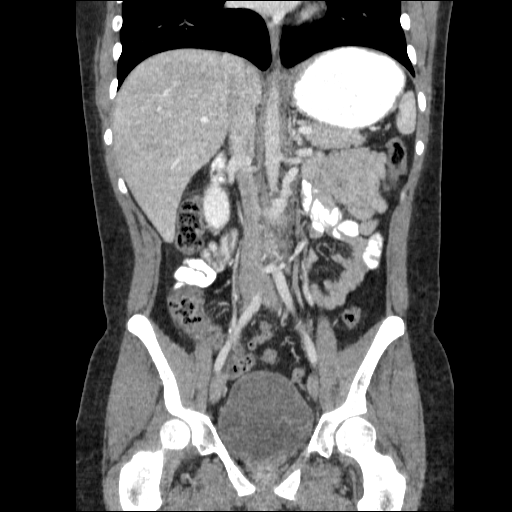

[17 of 46 positions shown; findings below may reference images not displayed]

FINDINGS: Lung bases are within normal.

Abdominal images demonstrate a normal liver, spleen, pancreas and
adrenal glands. There is no evidence of cholelithiasis. There is a
small amount of pericholecystic fluid versus gallbladder wall
thickening. Kidneys an ureters are normal.

The appendix measures 9 mm in diameter with mild mucosal
enhancement. There is no significant adjacent inflammation or free
fluid. No evidence of perforation or abscess. The appendix courses
posteriorly as the tip is located just superior to the uterine
fundus right of midline.

Pelvic images demonstrate a small amount of fluid in the cul-de-sac.
Uterus nor ovaries are unremarkable. Remaining bones and soft
tissues are within normal.
IMPRESSION: Appendix slightly enlarged measuring 9 mm in diameter with mild
mucosal enhancement. No significant adjacent inflammatory change.
Minimal free fluid in the pelvis. Findings are concerning for early
acute appendicitis.

Mild pericholecystic fluid versus gallbladder wall thickening.
Consider right upper quadrant ultrasound for better evaluation if
clinically indicated.

Critical Value/emergent results were called by telephone at the time
of interpretation on 08/26/2013 at [DATE] to Dr. Duaine Darley , who
verbally acknowledged these results.

## 2015-10-06 ENCOUNTER — Ambulatory Visit (INDEPENDENT_AMBULATORY_CARE_PROVIDER_SITE_OTHER): Payer: 59 | Admitting: Internal Medicine

## 2015-10-06 VITALS — BP 120/70 | HR 92 | Temp 98.2°F | Resp 20 | Ht 61.0 in | Wt 139.4 lb

## 2015-10-06 DIAGNOSIS — J111 Influenza due to unidentified influenza virus with other respiratory manifestations: Secondary | ICD-10-CM

## 2015-10-06 DIAGNOSIS — R059 Cough, unspecified: Secondary | ICD-10-CM

## 2015-10-06 DIAGNOSIS — N644 Mastodynia: Secondary | ICD-10-CM | POA: Diagnosis not present

## 2015-10-06 DIAGNOSIS — R05 Cough: Secondary | ICD-10-CM

## 2015-10-06 MED ORDER — MELOXICAM 15 MG PO TABS: 15.0000 mg | ORAL_TABLET | Freq: Every day | ORAL | Status: AC

## 2015-10-06 MED ORDER — HYDROCODONE-HOMATROPINE 5-1.5 MG/5ML PO SYRP: 5.0000 mL | ORAL_SOLUTION | Freq: Four times a day (QID) | ORAL | Status: AC | PRN

## 2015-10-06 NOTE — Patient Instructions (Signed)
     IF you received an x-ray today, you will receive an invoice from [facility]. Please contact [facility] at [phone] with questions or concerns regarding your invoice.   IF you received labwork today, you will receive an invoice from [facility]. Please contact [facility] at [phone] with questions or concerns regarding your invoice.   Our billing staff will not be able to assist you with questions regarding bills from these companies.  You will be contacted with the lab results as soon as they are available. The fastest way to get your results is to activate your My Chart account. Instructions are located on the last page of this paperwork. If you have not heard from us regarding the results in 2 weeks, please contact this office.      

## 2015-10-06 NOTE — Progress Notes (Addendum)
Subjective:  By signing my name below, I, Stann Ore, attest that this documentation has been prepared under the direction and in the presence of Ellamae Sia, MD. Electronically Signed: Stann Ore, Scribe. 10/06/2015 , 4:15 PM .  Patient was seen in Room 14 .   Patient ID: Sophia [name], female    DOB: 06/08/1991, 25 y.o.   MRN: [id number] Chief Complaint  Patient presents with  . Headache    x 2days  . Fever  . Breast Pain    left side  . Cough  . Depression    see screening   HPI Amariyah Bazar is a 25 y.o. female who presents to [facility] complaining of cough with nasal congestion and headache that started 2 days ago. She states that she's coughing a lot which exacerbates the headache and also with productive green sputum. She also notes having myalgia all over making her very fatigue and drained. She's been having nasal congestion and night sweats at night. She's felt very hot but without any measurement. She believes possible sick contact while at work. She denies any sore throat, wheezing, ear pain or rhinorrhea. She denies receiving flu shot this year.   She mentions that her headaches happen occasionally, especially while she's at work. She spends about 8 hours a day, watching over 2 computer monitors. She's taken ibuprofen for it in the past without much relief. She's also taken a coworker's migraine medication for relief (believes it was excedrin-migraine). She wears contacts for vision correction.   She also reports having breast tenderness, worsened over past 2 days. Initially, both her breasts had some tenderness, but the right breast has resolved. Her tenderness is located around the areola. She denies any breast discharge. She denies extra caffeine lately. She denies injury to the area. She denies regular breast tenderness to the area. She has birth control pills but doesn't like taking them because she often forgets. She uses condoms during intercourse.   She  works in [employer], industrial/product.   Patient Active Problem List   Diagnosis Date Noted  . Acute appendicitis 08/26/2013    Current outpatient prescriptions:  .  HYDROcodone-homatropine (HYCODAN) 5-1.5 MG/5ML syrup, Take 5 mLs by mouth every 6 (six) hours as needed for cough., Disp: 120 mL, Rfl: 0 .  AZURETTE 0.15-0.02/0.01 MG (21/5) tablet, TK 1 T PO D, Disp: , Rfl: 9 .  meloxicam (MOBIC) 15 MG tablet, Take 1 tablet (15 mg total) by mouth daily. For fever or headache, Disp: 20 tablet, Rfl: 0  Review of Systems  Constitutional: Positive for fatigue. Negative for fever and chills.  HENT: Positive for congestion. Negative for ear pain, rhinorrhea and sore throat.   Respiratory: Positive for cough. Negative for shortness of breath and wheezing.   Gastrointestinal: Negative for nausea, vomiting and diarrhea.  Genitourinary: Negative for menstrual problem.  Musculoskeletal: Positive for myalgias.  Neurological: Positive for headaches.      Objective:   Physical Exam  Constitutional: She is oriented to person, place, and time. She appears well-developed and well-nourished. No distress.  HENT:  Head: Normocephalic and atraumatic.  Right Ear: Tympanic membrane and external ear normal.  Left Ear: Tympanic membrane and external ear normal.  Nose: Nose normal.  Mouth/Throat: Oropharynx is clear and moist and mucous membranes are normal. No oropharyngeal exudate.  Eyes: Conjunctivae and EOM are normal. Pupils are equal, round, and reactive to light.  Neck: Neck supple.  Cardiovascular: Normal rate, regular rhythm and normal heart sounds.   Pulmonary/Chest: Effort  normal. No respiratory distress. She has no wheezes.  Breasts: sensitive to touch at the periareolar area, no swelling, redness, or nipple discharge No masses  Musculoskeletal: Normal range of motion.  Lymphadenopathy:    She has no cervical adenopathy.  Neurological: She is alert and oriented to person, place, and time.  Skin: Skin is  warm and dry. No rash noted.  Psychiatric: She has a normal mood and affect. Her behavior is normal.  Nursing note and vitals reviewed.  BP 120/70 mmHg  Pulse 92  Temp(Src) 98.2 F (36.8 C) (Oral)  Resp 20  Ht 5\' 1"  (1.549 m)  Wt 139 lb 6.4 oz (63.231 kg)  BMI 26.35 kg/m2  SpO2 98%  LMP 09/18/2015    Assessment & Plan:  I have completed the patient encounter in its entirety as documented by the scribe, with editing by me where necessary. Preslie Depasquale P. Merla Richesoolittle, M.D.   Flu - with cough Plan: HYDROcodone-homatropine (HYCODAN) 5-1.5 MG/5ML syrup//melox ha and fev  Breast tenderness in female---environmental--should resolve w/out tx   Meds ordered this encounter  Medications  . meloxicam (MOBIC) 15 MG tablet    Sig: Take 1 tablet (15 mg total) by mouth daily. For fever or headache    Dispense:  20 tablet    Refill:  0  . HYDROcodone-homatropine (HYCODAN) 5-1.5 MG/5ML syrup    Sig: Take 5 mLs by mouth every 6 (six) hours as needed for cough.    Dispense:  120 mL    Refill:  0
